# Patient Record
Sex: Male | Born: 1970 | Race: Black or African American | Hispanic: No | Marital: Single | Smoking: Former smoker
Health system: Southern US, Community
[De-identification: ages and names within clinical notes are randomized; demographics above are authoritative.]

## PROBLEM LIST (undated history)

## (undated) DIAGNOSIS — L910 Hypertrophic scar: Secondary | ICD-10-CM

## (undated) DIAGNOSIS — Z720 Tobacco use: Secondary | ICD-10-CM

## (undated) DIAGNOSIS — K5900 Constipation, unspecified: Secondary | ICD-10-CM

## (undated) DIAGNOSIS — J939 Pneumothorax, unspecified: Secondary | ICD-10-CM

## (undated) DIAGNOSIS — E7800 Pure hypercholesterolemia, unspecified: Secondary | ICD-10-CM

## (undated) DIAGNOSIS — R140 Abdominal distension (gaseous): Secondary | ICD-10-CM

## (undated) DIAGNOSIS — J189 Pneumonia, unspecified organism: Secondary | ICD-10-CM

## (undated) HISTORY — DX: Abdominal distension (gaseous): R14.0

## (undated) HISTORY — DX: Pneumothorax, unspecified: J93.9

## (undated) HISTORY — DX: Constipation, unspecified: K59.00

## (undated) HISTORY — DX: Pneumonia, unspecified organism: J18.9

## (undated) HISTORY — DX: Tobacco use: Z72.0

## (undated) HISTORY — DX: Hypertrophic scar: L91.0

---

## 2002-12-13 HISTORY — PX: CHEST TUBE INSERTION: SHX231

## 2005-01-11 ENCOUNTER — Emergency Department (HOSPITAL_COMMUNITY): Admission: EM | Admit: 2005-01-11 | Discharge: 2005-01-11 | Payer: Self-pay | Admitting: Emergency Medicine

## 2006-03-08 ENCOUNTER — Ambulatory Visit (HOSPITAL_BASED_OUTPATIENT_CLINIC_OR_DEPARTMENT_OTHER): Admission: RE | Admit: 2006-03-08 | Discharge: 2006-03-08 | Payer: Self-pay | Admitting: General Surgery

## 2006-03-08 ENCOUNTER — Encounter (INDEPENDENT_AMBULATORY_CARE_PROVIDER_SITE_OTHER): Payer: Self-pay | Admitting: *Deleted

## 2007-07-31 ENCOUNTER — Emergency Department (HOSPITAL_COMMUNITY): Admission: EM | Admit: 2007-07-31 | Discharge: 2007-07-31 | Payer: Self-pay | Admitting: Family Medicine

## 2007-08-02 ENCOUNTER — Emergency Department (HOSPITAL_COMMUNITY): Admission: EM | Admit: 2007-08-02 | Discharge: 2007-08-02 | Payer: Self-pay | Admitting: Emergency Medicine

## 2007-08-25 ENCOUNTER — Emergency Department (HOSPITAL_COMMUNITY): Admission: EM | Admit: 2007-08-25 | Discharge: 2007-08-25 | Payer: Self-pay | Admitting: Family Medicine

## 2007-08-27 ENCOUNTER — Emergency Department (HOSPITAL_COMMUNITY): Admission: EM | Admit: 2007-08-27 | Discharge: 2007-08-27 | Payer: Self-pay | Admitting: Emergency Medicine

## 2010-12-13 DIAGNOSIS — J189 Pneumonia, unspecified organism: Secondary | ICD-10-CM

## 2010-12-13 HISTORY — DX: Pneumonia, unspecified organism: J18.9

## 2011-02-13 ENCOUNTER — Emergency Department (HOSPITAL_COMMUNITY): Payer: Self-pay

## 2011-02-13 ENCOUNTER — Inpatient Hospital Stay (HOSPITAL_COMMUNITY)
Admission: EM | Admit: 2011-02-13 | Discharge: 2011-02-16 | DRG: 195 | Disposition: A | Discharge status: Home / Self Care | Payer: Self-pay | Attending: Internal Medicine | Admitting: Internal Medicine

## 2011-02-13 DIAGNOSIS — R911 Solitary pulmonary nodule: Secondary | ICD-10-CM | POA: Diagnosis present

## 2011-02-13 DIAGNOSIS — J449 Chronic obstructive pulmonary disease, unspecified: Secondary | ICD-10-CM | POA: Diagnosis present

## 2011-02-13 DIAGNOSIS — J4489 Other specified chronic obstructive pulmonary disease: Secondary | ICD-10-CM | POA: Diagnosis present

## 2011-02-13 DIAGNOSIS — F17200 Nicotine dependence, unspecified, uncomplicated: Secondary | ICD-10-CM | POA: Diagnosis present

## 2011-02-13 DIAGNOSIS — J189 Pneumonia, unspecified organism: Principal | ICD-10-CM | POA: Diagnosis present

## 2011-02-13 DIAGNOSIS — D72829 Elevated white blood cell count, unspecified: Secondary | ICD-10-CM | POA: Diagnosis present

## 2011-02-13 LAB — COMPREHENSIVE METABOLIC PANEL
ALT: 13 U/L (ref 0–53)
AST: 16 U/L (ref 0–37)
Albumin: 3.5 g/dL (ref 3.5–5.2)
Alkaline Phosphatase: 65 U/L (ref 39–117)
BUN: 8 mg/dL (ref 6–23)
CO2: 26 mEq/L (ref 19–32)
Calcium: 8.7 mg/dL (ref 8.4–10.5)
Chloride: 101 mEq/L (ref 96–112)
Creatinine, Ser: 0.83 mg/dL (ref 0.4–1.5)
GFR calc Af Amer: >60 mL/min (ref >60)
GFR calc non Af Amer: >60 mL/min (ref >60)
Glucose, Bld: 93 mg/dL (ref 70–99)
Potassium: 4 mEq/L (ref 3.5–5.1)
Sodium: 137 mEq/L (ref 135–145)
Total Bilirubin: 0.7 mg/dL (ref 0.3–1.2)
Total Protein: 6.8 g/dL (ref 6.0–8.3)

## 2011-02-13 LAB — CBC
HCT: 42.6 % (ref 39.0–52.0)
Hemoglobin: 14.9 g/dL (ref 13.0–17.0)
MCH: 32.5 pg (ref 26.0–34.0)
MCHC: 35 g/dL (ref 30.0–36.0)
MCV: 92.8 fL (ref 78.0–100.0)
Platelets: 261 10*3/uL (ref 150–400)
RBC: 4.59 MIL/uL (ref 4.22–5.81)
RDW: 13.4 % (ref 11.5–15.5)
WBC: 17 10*3/uL — ABNORMAL HIGH (ref 4.0–10.5)

## 2011-02-13 LAB — DIFFERENTIAL
Basophils Absolute: 0 10*3/uL (ref 0.0–0.1)
Basophils Relative: 0 % (ref 0–1)
Eosinophils Absolute: 0.1 10*3/uL (ref 0.0–0.7)
Eosinophils Relative: 1 % (ref 0–5)
Lymphocytes Relative: 18 % (ref 12–46)
Lymphs Abs: 2.9 10*3/uL (ref 0.7–4.0)
Monocytes Absolute: 1.8 10*3/uL — ABNORMAL HIGH (ref 0.1–1.0)
Monocytes Relative: 11 % (ref 3–12)
Neutro Abs: 11.7 10*3/uL — ABNORMAL HIGH (ref 1.7–7.7)
Neutrophils Relative %: 71 % (ref 43–77)

## 2011-02-13 LAB — LIPASE, BLOOD: Lipase: 24 U/L (ref 11–59)

## 2011-02-13 LAB — D-DIMER, QUANTITATIVE: D-Dimer, Quant: 1.24 ug/mL-FEU — ABNORMAL HIGH (ref 0.00–0.48)

## 2011-02-13 MED ORDER — IOHEXOL 300 MG/ML  SOLN
100.0000 mL | Freq: Once | INTRAMUSCULAR | Status: AC | PRN
  Administered 2011-02-13: 100 mL via INTRAVENOUS

## 2011-02-14 ENCOUNTER — Inpatient Hospital Stay (HOSPITAL_COMMUNITY): Payer: Self-pay

## 2011-02-14 ENCOUNTER — Emergency Department (HOSPITAL_COMMUNITY): Payer: Self-pay

## 2011-02-14 LAB — URINALYSIS, ROUTINE W REFLEX MICROSCOPIC
Bilirubin Urine: NEGATIVE
Glucose, UA: NEGATIVE mg/dL
Ketones, ur: NEGATIVE mg/dL
Leukocytes, UA: NEGATIVE
Nitrite: NEGATIVE
Protein, ur: NEGATIVE mg/dL
Specific Gravity, Urine: 1.026 (ref 1.005–1.030)
Urobilinogen, UA: 1 mg/dL (ref 0.0–1.0)
pH: 6 (ref 5.0–8.0)

## 2011-02-14 LAB — COMPREHENSIVE METABOLIC PANEL
ALT: 11 U/L (ref 0–53)
AST: 12 U/L (ref 0–37)
Albumin: 3.1 g/dL — ABNORMAL LOW (ref 3.5–5.2)
Alkaline Phosphatase: 59 U/L (ref 39–117)
BUN: 5 mg/dL — ABNORMAL LOW (ref 6–23)
CO2: 27 mEq/L (ref 19–32)
Calcium: 8.6 mg/dL (ref 8.4–10.5)
Chloride: 102 mEq/L (ref 96–112)
Creatinine, Ser: 0.88 mg/dL (ref 0.4–1.5)
GFR calc Af Amer: >60 mL/min (ref >60)
GFR calc non Af Amer: >60 mL/min (ref >60)
Glucose, Bld: 108 mg/dL — ABNORMAL HIGH (ref 70–99)
Potassium: 3.7 mEq/L (ref 3.5–5.1)
Sodium: 139 mEq/L (ref 135–145)
Total Bilirubin: 1.2 mg/dL (ref 0.3–1.2)
Total Protein: 6.4 g/dL (ref 6.0–8.3)

## 2011-02-14 LAB — MAGNESIUM: Magnesium: 1.8 mg/dL (ref 1.5–2.5)

## 2011-02-14 LAB — APTT: aPTT: 36 seconds (ref 24–37)

## 2011-02-14 LAB — CBC
HCT: 40.6 % (ref 39.0–52.0)
Hemoglobin: 13.7 g/dL (ref 13.0–17.0)
MCH: 31.4 pg (ref 26.0–34.0)
MCHC: 33.7 g/dL (ref 30.0–36.0)
MCV: 93.1 fL (ref 78.0–100.0)
Platelets: 239 10*3/uL (ref 150–400)
RBC: 4.36 MIL/uL (ref 4.22–5.81)
RDW: 13.4 % (ref 11.5–15.5)
WBC: 15.8 10*3/uL — ABNORMAL HIGH (ref 4.0–10.5)

## 2011-02-14 LAB — DIFFERENTIAL
Basophils Absolute: 0 10*3/uL (ref 0.0–0.1)
Basophils Relative: 0 % (ref 0–1)
Eosinophils Absolute: 0.1 10*3/uL (ref 0.0–0.7)
Eosinophils Relative: 1 % (ref 0–5)
Lymphocytes Relative: 10 % — ABNORMAL LOW (ref 12–46)
Lymphs Abs: 1.6 10*3/uL (ref 0.7–4.0)
Monocytes Absolute: 1.9 10*3/uL — ABNORMAL HIGH (ref 0.1–1.0)
Monocytes Relative: 12 % (ref 3–12)
Neutro Abs: 12.1 10*3/uL — ABNORMAL HIGH (ref 1.7–7.7)
Neutrophils Relative %: 77 % (ref 43–77)

## 2011-02-14 LAB — URINE MICROSCOPIC-ADD ON

## 2011-02-14 LAB — PHOSPHORUS: Phosphorus: 3.4 mg/dL (ref 2.3–4.6)

## 2011-02-14 LAB — LACTIC ACID, PLASMA: Lactic Acid, Venous: 2.6 mmol/L — ABNORMAL HIGH (ref 0.5–2.2)

## 2011-02-14 LAB — PROTIME-INR
INR: 1.12 (ref 0.00–1.49)
Prothrombin Time: 14.6 seconds (ref 11.6–15.2)

## 2011-02-14 MED ORDER — IOHEXOL 300 MG/ML  SOLN: 130.0000 mL | Freq: Once | INTRAMUSCULAR | Status: AC | PRN

## 2011-02-15 DIAGNOSIS — J189 Pneumonia, unspecified organism: Secondary | ICD-10-CM

## 2011-02-15 DIAGNOSIS — F17200 Nicotine dependence, unspecified, uncomplicated: Secondary | ICD-10-CM

## 2011-02-15 DIAGNOSIS — R222 Localized swelling, mass and lump, trunk: Secondary | ICD-10-CM

## 2011-02-15 LAB — URINE CULTURE
Colony Count: NO GROWTH
Culture  Setup Time: 201203041533
Culture: NO GROWTH

## 2011-02-15 LAB — COMPREHENSIVE METABOLIC PANEL
ALT: 10 U/L (ref 0–53)
AST: 13 U/L (ref 0–37)
Albumin: 2.8 g/dL — ABNORMAL LOW (ref 3.5–5.2)
Alkaline Phosphatase: 56 U/L (ref 39–117)
BUN: 6 mg/dL (ref 6–23)
CO2: 28 mEq/L (ref 19–32)
Calcium: 8.6 mg/dL (ref 8.4–10.5)
Chloride: 104 mEq/L (ref 96–112)
Creatinine, Ser: 1.06 mg/dL (ref 0.4–1.5)
GFR calc Af Amer: >60 mL/min (ref >60)
GFR calc non Af Amer: >60 mL/min (ref >60)
Glucose, Bld: 103 mg/dL — ABNORMAL HIGH (ref 70–99)
Potassium: 3.8 mEq/L (ref 3.5–5.1)
Sodium: 138 mEq/L (ref 135–145)
Total Bilirubin: 1.1 mg/dL (ref 0.3–1.2)
Total Protein: 6.3 g/dL (ref 6.0–8.3)

## 2011-02-15 LAB — CBC
HCT: 40.3 % (ref 39.0–52.0)
Hemoglobin: 13.7 g/dL (ref 13.0–17.0)
MCH: 31.9 pg (ref 26.0–34.0)
MCHC: 34 g/dL (ref 30.0–36.0)
MCV: 93.7 fL (ref 78.0–100.0)
Platelets: 253 10*3/uL (ref 150–400)
RBC: 4.3 MIL/uL (ref 4.22–5.81)
RDW: 13.2 % (ref 11.5–15.5)
WBC: 12.2 10*3/uL — ABNORMAL HIGH (ref 4.0–10.5)

## 2011-02-20 LAB — CULTURE, BLOOD (ROUTINE X 2)
Culture  Setup Time: 201203041124
Culture  Setup Time: 201203041124
Culture: NO GROWTH
Culture: NO GROWTH

## 2011-02-23 ENCOUNTER — Telehealth: Payer: Self-pay | Admitting: Emergency Medicine

## 2011-02-23 DIAGNOSIS — J984 Other disorders of lung: Secondary | ICD-10-CM | POA: Insufficient documentation

## 2011-02-24 ENCOUNTER — Other Ambulatory Visit: Payer: Self-pay | Admitting: Emergency Medicine

## 2011-02-24 DIAGNOSIS — R911 Solitary pulmonary nodule: Secondary | ICD-10-CM

## 2011-02-25 NOTE — Discharge Summary (Signed)
NAMEHILMER, ALIBERTI                  ACCOUNT NO.:  [id number]  MEDICAL RECORD NO.:  [id number]           PATIENT TYPE:  I  LOCATION:  5526                         FACILITY:  [facility]  PHYSICIAN:  Peggye Pitt, M.D. DATE OF BIRTH:  04/09/1971  DATE OF ADMISSION:  02/13/2011 DATE OF DISCHARGE:  02/16/2011                              DISCHARGE SUMMARY   DISCHARGE DIAGNOSES: 1. Right base airspace disease, query pneumonia. 2. Multiple pulmonary nodules, question infectious versus malignancy.  DISCHARGE MEDICATIONS: 1. Avelox 400 mg daily for 14 days. 2. Nicotine patch 21 mg to change daily. 3. Ibuprofen 200 mg 2-3 tablets 3 times a day as needed for pain.  CONSULTATION THIS HOSPITALIZATION:  Dr. Darrol Angel.  DISPOSITION AND FOLLOWUP:  Wesley Alvarez will be discharged home today in stable condition.  He will need to take 14 days of antibiotics and then follow up with Dr. Delton Coombes on March 05, 2011, at 1:30 p.m. at the [facility] office.  He will need to have a CT scan scheduled shortly before that appointment time, and I will have our case manager schedule that for [place].  PROCEDURES THIS HOSPITALIZATION: 1. A chest x-ray on February 13, 2011, that showed bronchitic-type lung     changes, likely related to smoking with no infiltrates, edema, or     effusions. 2. CT angiogram of the chest on February 13, 2011, that showed no CT     findings for pulmonary embolism.  Normal thoracic aorta.  Three     pulmonary lesions are described above.  Septic emboli and     metastatic disease are considerations.  Borderline enlarged hilar     lymph nodes, right greater than left.  Bullous changes. 3. A CT scan of the abdomen and pelvis on February 14, 2011, that showed     left lower lobe pulmonary nodule with posterior right lower lobe     airspace consolidation.  The right lower lobe disease has     progressed since yesterday's chest CT.  No findings in the abdomen     and pelvis to suggest neoplasm. 4. A CT  scan of the head on February 14, 2011, that was normal with mild     chronic sinusitis.  HISTORY AND PHYSICAL:  For details, please see dictation by Dr. Gerri Lins on February 14, 2011, but in brief, Mr. Dollar is a pleasant 40 year old African American gentleman with past medical history significant for tobacco abuse, who presented to the hospital with right-sided chest pain.  In the workup, a CT angio was done with findings as above and because of this, we were asked to admit him for further evaluation and management.  HOSPITAL COURSE BY PROBLEM: 1. Right basilar airspace disease.  He was started on antibiotic     therapy including vancomycin and Zosyn for a possibility of     pneumonia.  There were also some pulmonary nodules that could have     been septic emboli and this was the main reason for the broad-     spectrum therapy.  He will be discharged to complete 14 days of  Avelox.  Please see below for details. 2. Multiple pulmonary nodules.  CT scan was concerning for malignancy     versus septic emboli.  A TEE was performed that did not show any     evidence for vegetations, thrombus.  There was a negative study for     PFOs.  Pulmonology consult was obtained.  They have recommended     that given the negative TEE finding that we give him a 14-day     course of antibiotics to see if the nodules resolve or improve.     After his 14 days of antibiotics, he will need another repeat a     chest CT with followup by Dr. Delton Coombes as scheduled above to determine     whether bronchoscopy may be indicated.  If biopsy is indicated,     given location of nodules, I believe that mediastinoscopy would     probably be higher yield than a bronchoscopy would be. 3. For his tobacco abuse, he has been given aggressive tobacco     cessation, counseling while in the hospital.  He agrees to quit     smoking.  We have given him some patches to assist him.  VITALS ON THE DAY OF DISCHARGE:  Blood pressure 129/88,  heart rate 81, respirations 20, sats of 99% on room air, and a temp of 97.9.     Peggye Pitt, M.D.     EH/MEDQ  D:  02/16/2011  T:  02/17/2011  Job:  [id number]  cc:   Leslye Peer, MD Dr. Darrol Angel  Electronically Signed by Peggye Pitt M.D. on 02/25/2011 05:43:19 PM

## 2011-02-26 NOTE — H&P (Signed)
Wesley Alvarez, Wesley Alvarez                  ACCOUNT NO.:  [id number]  MEDICAL RECORD NO.:  [id number]           PATIENT TYPE:  I  LOCATION:  5526                         FACILITY:  [facility]  PHYSICIAN:  Treylin Burtch, DO         DATE OF BIRTH:  [birth year]  DATE OF ADMISSION:  02/13/2011 DATE OF DISCHARGE:                             HISTORY & PHYSICAL   CHIEF COMPLAINT:  Right-sided chest pain.  HISTORY OF PRESENT ILLNESS:  The patient is a 40 year old male who states he has no known prior health issues poor.  Of course, he did not see a doctor, but that was his impression.  Patient states about 3 days ago, he was coughing.  He does have a smoker's cough every morning.  The cough itself was nothing unusual, but he states he heard a pop and then he had pain on the right side of his chest going through to his back and radiated up towards his shoulder blades.  Since that time, he states the pain has been almost continuous.  It is aggravated by movement or manipulation of his upper body.  It is not made worse by exertion per se.  He can walk without making the pain worse.  He states although he is not really short of breath, it makes him feel short of breath because it hurts to take a deep breath.  He states that it ranges from 5/10 to 10/10 in severity.  It is not associated with nausea and vomiting. Patient has had a poor appetite for some time according to his wife for about 6 weeks or so.  The patient any loss of weight or fevers or chills.  PAST MEDICAL HISTORY:  The patient has had a left chest spontaneous pneumothorax in the past.  No past surgical history.  MEDICATIONS:  Advil, that is what he has been taking for pain.  SOCIAL HISTORY:  The patient has about a 30-pack year smoking history. He continues to smoke, drinks alcohol occasionally.  Denies any recreational drug use.  Denies any IV drug use at all.  ALLERGIES:  NKDA.  FAMILY HISTORY:  Positive for head and neck cancer in his  father.  The patient is unable to remember what exact kind of cancer it was.  His father was 73 at that time.  His mother is healthy.  REVIEW OF SYSTEMS:  CONSTITUTIONAL:  Negative for fever.  Negative for chills.  Positive for loss of appetite.  No loss of weight.  Positive for malaise.  CENTRAL NERVOUS SYSTEM:  No headaches, no seizures, no limb weakness.  ENT:  No nasal congestion, throat pain.  CARDIOVASCULAR: Positive for chest pain that is pleuritic and mostly related on the right.  No palpitations, no orthopnea.  RESPIRATORY:  Positive for received shortness of breath, simply because it hurts the right side of his chest when he takes a deep breath, but otherwise he states he does not feel short of breath.  No increased cough.  The patient has a chronic smoker's cough every morning.  No sputum production.  No wheeze. GASTROINTESTINAL:  No nausea,  no vomiting.  No constipation, no diarrhea.  GENITOURINARY:  No dysuria, hematuria, no urinary frequency. HEMATOLOGICAL:  No easy bruising, no purpura, no clots.  RENAL:  No flank pain.  No swelling.  No pruritus.  SKIN:  No rashes or sores.  No lesions.  The patient does occasionally gets sebaceous cyst, but he does not have any at this current time.  LYMPH:  No lymphadenopathy.  No painful nodes.  No specific lymph swelling.  PSYCHIATRIC:  No anxiety, no depression, no insomnia.  PHYSICAL EXAMINATION:  VITAL SIGNS:  Temperature 98, pulse 99, respiratory rate 18, blood pressure 139/101, O2 sats 100% on 2 liters. GENERAL:  The patient is awake, but somnolent.  He is able to give fair history. HEENT:  Eyes:  Pupils equal, round, react to light and accommodation. External ocular movements are intact.  Sclerae nonicteric, noninjected. Mouth:  Oral mucosa is dry.  No lesions.  No sores.  Pharynx is clear. No erythema, no exudate. NECK: Negative for JVD.  Negative for thyromegaly.  Negative for lymphadenopathy. HEART:  Regular rate and  rhythm at 90 beats per minute without murmur, ectopy, or gallops.  No lateral PMI.  No thrills. LUNGS:  Clear to auscultation bilaterally without wheezes, rales or rhonchi.  No increased work of breathing.  No tactile fremitus. ABDOMEN:  Soft, nontender, nondistended.  Positive bowel sounds.  No hepatosplenomegaly.  No hernias palpated. EXTREMITIES:  Negative for cyanosis, clubbing, or edema.  Fair dorsalis pedis and popliteal pulses bilaterally.  No carotid bruits bilaterally. NEUROLOGIC: Cranial nerves II-XII are grossly intact.  Motor and sensory intact.  LABORATORY DATA:  WBC is 17.0, hemoglobin 49, hematocrit 42.6, platelets 261.  He has 71% neutrophils.  Sodium 137, potassium for 4.0, chloride 101, CO2 26, BUN is 8, creatinine 0.83, glucose 93, albumin 3.5, calcium 8.7, T-bili 0.7, AST 16, ALT 13, total protein 6.8.  Lactic acid is 2.6, lipase is 24.  CT of the chest shows biapical bullous changes, right greater than left.  The patient has a rounded density of the right lung base, atelectasis versus mass, and he has 2 more rounded lesions on the left side of his chest, one in left lower lobe, one in the left upper lobe, which has a slight cavitation to suggest it may be that these represent either a metastatic disease or septic emboli.  Portable chest shows bronchitic changes.  ASSESSMENT: 1. Abnormal CTA of the chest.  Concern is for septic emboli,     particularly with the patient's elevated white blood cell count     versus metastatic disease. 2. Chest pain on the right, likely due to mass versus annulus emboli. 3. Chronic obstructive pulmonary disease, which appears quiescent. 4. Leukocytosis, likely due to this mass versus emboli. 5. Tobacco abuse, which is ongoing.  PLAN: 1. Admit. 2. Blood cultures x2. 3. Empiric antibiotic coverage. 4. Echocardiogram.  We will start with a transthoracic echocardiogram.     More than likely, this patient will require TEE. 5.  Pulmonary consult. 6. CT brain, abdomen, and pelvis. 7. Check a PSA.  I spent 54 minutes on this admission.          ______________________________ Fran Lowes, DO     AS/MEDQ  D:  02/14/2011  T:  02/14/2011  Job:  [id number]  Electronically Signed by Fran Lowes DO on 02/26/2011 04:52:22 PM

## 2011-03-01 ENCOUNTER — Ambulatory Visit (INDEPENDENT_AMBULATORY_CARE_PROVIDER_SITE_OTHER)
Admission: RE | Admit: 2011-03-01 | Discharge: 2011-03-01 | Disposition: A | Discharge status: Home / Self Care | Payer: Self-pay | Source: Ambulatory Visit | Attending: Emergency Medicine | Admitting: Emergency Medicine

## 2011-03-01 DIAGNOSIS — J984 Other disorders of lung: Secondary | ICD-10-CM

## 2011-03-01 DIAGNOSIS — R911 Solitary pulmonary nodule: Secondary | ICD-10-CM

## 2011-03-01 MED ORDER — IOHEXOL 300 MG/ML  SOLN
98.0000 mL | Freq: Once | INTRAMUSCULAR | Status: AC | PRN
  Administered 2011-03-01: 98 mL via INTRAVENOUS

## 2011-03-02 NOTE — Progress Notes (Signed)
Summary: needs chest ct prior to 03/05/11 appt  Phone Note Call from Patient   Caller: Patient Call For: Azani Brogdon Summary of Call: Patient phoned stated that his discharge papers stated that he needed to have a Chest CT a few days prior to his follow up with Dr Delton Coombes. His hospital follow up is scheduled for 03/05/11 at 1:30pm. Patient can be reached at [phone] Initial call taken by: Vedia Coffer,  February 23, 2011 9:12 AM  Follow-up for Phone Call        Dr. Delton Coombes this pt has a HFU scheduled with you on 03-05-11 and according to his discharge summary he needs a CT scheduled prior to that visit. Pt wants to  know if this can be ordered. Please advise. Carron Curie CMA  February 23, 2011 9:45 AM  He needs CT with contrast and superD cuts. I will place order now Leslye Peer MD  February 24, 2011 5:05 PM   Follow-up by: Leslye Peer MD,  February 24, 2011 5:06 PM  Additional Follow-up for Phone Call Additional follow up Details #1::        CT Chest with contrast and super D cuts has been scheduled for Monday 03/01/11 at 11:00 [facility] [address]. NPO 2 hours before. Pt has been contacted and is aware of appt date, time and location of CT as well as instructions. Alfonso Ramus  February 24, 2011 5:15 PM Rose at [facility] will send disk to Dr. Margaree Mackintosh  February 24, 2011 5:16 PM   New Problems: PULMONARY NODULE (ICD-518.89)   New Problems: PULMONARY NODULE (ICD-518.89)

## 2011-03-04 ENCOUNTER — Encounter: Payer: Self-pay | Admitting: Emergency Medicine

## 2011-03-04 DIAGNOSIS — Z720 Tobacco use: Secondary | ICD-10-CM

## 2011-03-04 DIAGNOSIS — J939 Pneumothorax, unspecified: Secondary | ICD-10-CM

## 2011-03-05 ENCOUNTER — Ambulatory Visit (INDEPENDENT_AMBULATORY_CARE_PROVIDER_SITE_OTHER): Payer: Self-pay | Admitting: Emergency Medicine

## 2011-03-05 ENCOUNTER — Encounter: Payer: Self-pay | Admitting: Emergency Medicine

## 2011-03-05 VITALS — BP 112/76 | HR 76 | Temp 97.4°F | Ht 72.0 in | Wt 194.6 lb

## 2011-03-05 DIAGNOSIS — J984 Other disorders of lung: Secondary | ICD-10-CM

## 2011-03-05 DIAGNOSIS — R911 Solitary pulmonary nodule: Secondary | ICD-10-CM

## 2011-03-05 NOTE — Assessment & Plan Note (Signed)
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

## 2011-03-05 NOTE — Patient Instructions (Signed)
We will repeat your CT scan of the chest in March 2013, or sooner if you develop new symptoms We will perform full PFT's at your next office visit We will perform bloodwork today to look for risk for early emphysema Follow up with Dr Delton Coombes next available.

## 2011-03-05 NOTE — Assessment & Plan Note (Signed)
Nodules have almost fully resolved, suspect these were bacterial PNA. TEE without evidence for endocarditis - no intervention at this time - repeat CT scan in a yr to insure no interval change,

## 2011-03-05 NOTE — Progress Notes (Signed)
Subjective:    Patient ID: Wesley Alvarez, male    DOB: 1971/07/03, 40 y.o.   MRN: [id number]  HPI 40 yo former smoker with little PMH except spont L PTX in the past. He was admitted to [facility] with chest discomfort and cough 02/13/11. His CT scan of the chest 02/13/11 showed scattered nodular disease and some apical emphysema/bullus dz. He was treated with abx, clinically improved. TEE without PVO or endocarditis. Repeat CT scan done  - shows almost full resolution of the nodular infiltrates. Presents now to review the findings. Of note the Ct scan shows apical emphysematous changes.      Review of Systems  Constitutional: Negative for fever, activity change, appetite change and fatigue.  HENT: Negative for congestion, rhinorrhea, sneezing, postnasal drip and sinus pressure.   Respiratory: Negative for cough, chest tightness, shortness of breath, wheezing and stridor.   Cardiovascular: Negative for chest pain.  Musculoskeletal: Negative for back pain.       Objective:   Physical Exam  Constitutional: He is oriented to person, place, and time. He appears well-developed and well-nourished. No distress.  HENT:  Head: Normocephalic and atraumatic.  Mouth/Throat: No oropharyngeal exudate.  Eyes: Conjunctivae are normal. Pupils are equal, round, and reactive to light.  Neck: Normal range of motion. Neck supple. No tracheal deviation present. No thyromegaly present.  Cardiovascular: Normal rate, regular rhythm and normal heart sounds.  Exam reveals no gallop and no friction rub.   No murmur heard. Pulmonary/Chest: Effort normal and breath sounds normal. No stridor. No respiratory distress. He has no wheezes. He has no rales. He exhibits no tenderness.  Abdominal: Soft.  Musculoskeletal: Normal range of motion. He exhibits no edema.  Lymphadenopathy:    He has no cervical adenopathy.  Neurological: He is alert and oriented to person, place, and time.  Skin: Skin is warm and dry. He is not  diaphoretic. No erythema.       CT scan 02/13/11:  Comparison: Chest x-ray 02/13/2011.  Findings: The chest wall is unremarkable. There is a sebaceous  cyst noted near the left axilla. Small axillary and  supraclavicular lymph nodes but no adenopathy. The thyroid gland  appears normal. The bony thorax is intact.  The heart is normal in size. No pericardial effusion. Small  scattered mediastinal lymph nodes without adenopathy. There are  borderline enlarged hilar lymph nodes, right greater than left.  The esophagus is grossly normal.  Examination of the lung parenchyma demonstrates biapical bullous  changes, right greater than left. No pneumothorax. There is a  rounded density at the right lung base without air bronchograms.  This could be a rounded atelectasis or a pulmonary mass. There is  a second rounded lesion in the left lower lobe on image number 64  which measures 15 mm. The third lesion in the left upper lobe on  image #54 demonstrates slight cavitation. The these findings could  be due to metastatic disease or possibly septic emboli.  The pulmonary arterial tree is well opacified. No filling defects  are seen to suggest pulmonary emboli. The aorta is normal in  caliber. No dissection  CT scan 02/24/11:  Comparison: 02/13/2011.  Findings: No pathologically enlarged mediastinal, hilar or  axillary lymph nodes. A low density lesion in the subcutaneous fat  of the left axilla measures 1.9 cm and is likely a sebaceous cyst.  Heart size normal. No pericardial effusion.  Bullous changes are seen in the apices bilaterally. There is a 4  mm nodule  in the right lower lobe (image 41). Interval improvement  in subpleural air space consolidation in the right lower lobe. Near  complete resolution of focal areas of rounded air space  consolidation in the lingula and left upper lobe (images 35 and 32,  respectively). Lungs are otherwise clear. No pleural fluid.  Airway is unremarkable.    Incidental imaging of the upper abdomen shows an unenlarged  retroperitoneal lymph node adjacent to the left adrenal gland. No  worrisome lytic or sclerotic lesions.  IMPRESSION:  Resolving foci of pneumonia in the lingula and both lower lobes.    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 TEE without evidence for endocarditis - no intervention at this time - repeat CT scan in a yr to insure no interval change,

## 2011-03-28 ENCOUNTER — Encounter: Payer: Self-pay | Admitting: Emergency Medicine

## 2011-04-09 ENCOUNTER — Encounter: Payer: Self-pay | Admitting: Emergency Medicine

## 2011-04-13 ENCOUNTER — Encounter: Payer: Self-pay | Admitting: Emergency Medicine

## 2011-04-13 ENCOUNTER — Ambulatory Visit (INDEPENDENT_AMBULATORY_CARE_PROVIDER_SITE_OTHER): Payer: Self-pay | Admitting: Emergency Medicine

## 2011-04-13 VITALS — BP 120/82 | HR 70 | Temp 97.8°F | Ht 72.0 in | Wt 193.0 lb

## 2011-04-13 DIAGNOSIS — J939 Pneumothorax, unspecified: Secondary | ICD-10-CM

## 2011-04-13 DIAGNOSIS — J984 Other disorders of lung: Secondary | ICD-10-CM

## 2011-04-13 DIAGNOSIS — J9383 Other pneumothorax: Secondary | ICD-10-CM

## 2011-04-13 LAB — PULMONARY FUNCTION TEST

## 2011-04-13 NOTE — Progress Notes (Signed)
PFT Done today. 

## 2011-04-13 NOTE — Patient Instructions (Signed)
We will need to repeat your CT scan of the chest in March 2013 Follow up with Dr Delton Coombes after the Ct to review the results.

## 2011-04-13 NOTE — Progress Notes (Signed)
  Subjective:    Patient ID: Wesley Alvarez, male    DOB: [birth year], 39 y.o.   MRN: [id number]  HPI 40 yo former smoker with little PMH except spont L PTX in the past. He was admitted to [facility] with chest discomfort and cough 02/13/11. His CT scan of the chest 02/13/11 showed scattered nodular disease and some apical emphysema/bullus dz. He was treated with abx, clinically improved. TEE without PVO or endocarditis. Repeat CT scan done  - shows almost full resolution of the nodular infiltrates. Presents now to review the findings. Of note the Ct scan shows apical emphysematous changes.     ROV 04/13/11 -- here to review PFT's and a1-AT testing (03/05/11 = MM, normal). CT scan showed resolving nodular dz but possible areas of emphysema.  PFT today - normal AF, no BD response, normal volumes, normal DLCO.    Review of Systems  Constitutional: Negative for fever, activity change, appetite change and fatigue.  HENT: Negative for congestion, rhinorrhea, sneezing, postnasal drip and sinus pressure.   Respiratory: Negative for cough, chest tightness, shortness of breath, wheezing and stridor.   Cardiovascular: Negative for chest pain.  Musculoskeletal: Negative for back pain.       Objective:   Physical Exam  Constitutional: He is oriented to person, place, and time. He appears well-developed and well-nourished. No distress.  HENT:  Head: Normocephalic and atraumatic.  Mouth/Throat: No oropharyngeal exudate.  Eyes: Conjunctivae are normal. Pupils are equal, round, and reactive to light.  Neck: Normal range of motion. Neck supple. No tracheal deviation present. No thyromegaly present.  Cardiovascular: Normal rate, regular rhythm and normal heart sounds.  Exam reveals no gallop and no friction rub.   No murmur heard. Pulmonary/Chest: Effort normal and breath sounds normal. No stridor. No respiratory distress. He has no wheezes. He has no rales. He exhibits no tenderness.  Abdominal: Soft.  Musculoskeletal:  Normal range of motion. He exhibits no edema.  Lymphadenopathy:    He has no cervical adenopathy.  Neurological: He is alert and oriented to person, place, and time.  Skin: Skin is warm and dry. He is not diaphoretic. No erythema.      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  - no other intervention or meds planned - congratulated him on stopping smoking  PULMONARY NODULE Nodules have almost fully resolved, suspect these were bacterial PNA. TEE without evidence for endocarditis - repeat CT scan in a yr to insure no interval change,

## 2011-04-23 ENCOUNTER — Encounter: Payer: Self-pay | Admitting: Emergency Medicine

## 2011-04-26 ENCOUNTER — Encounter: Payer: Self-pay | Admitting: Emergency Medicine

## 2011-04-30 NOTE — Op Note (Signed)
[name], Wesley Alvarez                  ACCOUNT NO.:  [id number]   MEDICAL RECORD NO.:  [id number]          PATIENT TYPE:  AMB   LOCATION:  DSC                          FACILITY:  [facility]   PHYSICIAN:  Wesley Alvarez, M.D.DATE OF BIRTH:  03/21/1971   DATE OF PROCEDURE:  03/08/2006  DATE OF DISCHARGE:                                 OPERATIVE REPORT   PREOPERATIVE DIAGNOSIS:  Soft tissue mass of posterior neck, suspect cyst.   POSTOP DIAGNOSIS:  2.0-cm cystic process, soft tissue posterior neck.   OPERATION PERFORMED:  Excision 2.0 cm soft tissue mass, posterior neck   SURGEON:  Wesley Alvarez, M.D.   OPERATIVE INDICATIONS:  A 40 year old black man who has had a 1-year history  of enlarging mass of posterior neck.  This has never been infected but it is  irritated and itching.  On exam he has what appears to be about a 2.5 cm  raised soft tissue mass in the midline posterior neck just below the  hairline.  There was no other adenopathy or mass.  He desires that this be  excised for diagnosis and to prevent future infection.  He has been  counseled regarding this and is operated upon electively.   OPERATIVE TECHNIQUE:  The patient was placed prone on the operating table in  this [facility] minor procedure room.  Posterior neck was prepped and  draped in sterile fashion.  1% Xylocaine with epinephrine was used as local  infiltration anesthetic.  A transverse elliptical incision was made  overlying the soft tissue mass.  This incision was probably 3.5 to 4 cm in  length to accommodate the size of this mass.  Dissection was carried around  and behind the mass with sharp dissection and the mass was removed.  The  mass was sent to pathology for examination.  Hemostasis was excellent.  The  wound was irrigated with saline.  Subcutaneous tissue was closed with three  interrupted sutures of 3-0 Vicryl, skin closed with five interrupted sutures  of 4-0 nylon.  Clean bandage was placed.   The patient taken recovery room in  stable condition.  Estimated blood loss was about 5 mL.  Complications none.  Sponge, needle, instrument counts were correct.   SUMMARY:  Bypass 89      Wesley Alvarez, M.D.  Electronically Signed     HMI/MEDQ  D:  03/08/2006  T:  03/09/2006  Job:  [id number]   cc:   Georgann Housekeeper, MD  Fax: [phone]

## 2011-09-24 LAB — CULTURE, ROUTINE-ABSCESS
Culture: NO GROWTH
Culture: NO GROWTH

## 2012-03-20 ENCOUNTER — Encounter (HOSPITAL_COMMUNITY): Payer: Self-pay | Admitting: *Deleted

## 2012-03-20 ENCOUNTER — Emergency Department (HOSPITAL_COMMUNITY)
Admission: EM | Admit: 2012-03-20 | Discharge: 2012-03-20 | Disposition: A | Discharge status: Home / Self Care | Payer: 59 | Attending: Emergency Medicine | Admitting: Emergency Medicine

## 2012-03-20 DIAGNOSIS — Z87891 Personal history of nicotine dependence: Secondary | ICD-10-CM | POA: Insufficient documentation

## 2012-03-20 DIAGNOSIS — Z882 Allergy status to sulfonamides status: Secondary | ICD-10-CM | POA: Insufficient documentation

## 2012-03-20 DIAGNOSIS — L02219 Cutaneous abscess of trunk, unspecified: Secondary | ICD-10-CM | POA: Insufficient documentation

## 2012-03-20 DIAGNOSIS — L02211 Cutaneous abscess of abdominal wall: Secondary | ICD-10-CM

## 2012-03-20 MED ORDER — CEPHALEXIN 500 MG PO CAPS: 500.0000 mg | ORAL_CAPSULE | Freq: Four times a day (QID) | ORAL | Status: AC

## 2012-03-20 MED ORDER — LIDOCAINE-EPINEPHRINE (PF) 2 %-1:200000 IJ SOLN
10.0000 mL | Freq: Once | INTRAMUSCULAR | Status: AC
  Administered 2012-03-20: 10 mL

## 2012-03-20 MED ORDER — HYDROCODONE-ACETAMINOPHEN 5-500 MG PO TABS: 1.0000 | ORAL_TABLET | Freq: Four times a day (QID) | ORAL | Status: AC | PRN

## 2012-03-20 NOTE — ED Provider Notes (Signed)
History     CSN: [id number]  Arrival date & time 03/20/12  0744   First MD Initiated Contact with Patient 03/20/12 [phone]      Chief Complaint  Patient presents with  . Abscess  . Abdominal Pain  . Constipation    (Consider location/radiation/quality/duration/timing/severity/associated sxs/prior treatment) Patient is a 41 y.o. male presenting with abscess. The history is provided by the patient.  Abscess  This is a new problem. The current episode started yesterday. The problem has been gradually worsening. The abscess is present on the abdomen. The problem is moderate. The abscess is characterized by redness, painfulness and swelling. Pertinent negatives include no fever and no vomiting.  Pt states noted a red bump on suprapubic area yesterday. Over night and today, area swelled up, red, painful to touch. States hx of abscesses. Did not take any medications. Denies fever, chills, malaise. No other complaints.  Past Medical History  Diagnosis Date  . Pneumothorax, left   . Tobacco abuse     History reviewed. No pertinent past surgical history.  Family History  Problem Relation Age of Onset  . Cancer Father 56    throat    History  Substance Use Topics  . Smoking status: Former Smoker -- 1.0 packs/day for 20 years    Types: Cigarettes  . Smokeless tobacco: Not on file  . Alcohol Use: No      Review of Systems  Constitutional: Negative for fever and chills.  HENT: Negative.   Eyes: Negative.   Respiratory: Negative.   Cardiovascular: Negative.   Gastrointestinal: Positive for abdominal pain and constipation. Negative for nausea and vomiting.  Genitourinary: Negative.   Musculoskeletal: Negative.   Skin:       ascess  Neurological: Negative.   Psychiatric/Behavioral: Negative.     Allergies  Sulfa antibiotics  Home Medications   Current Outpatient Rx  Name Route Sig Dispense Refill  . IBUPROFEN 100 MG PO TABS Oral Take 100 mg by mouth every 6 (six) hours  as needed.        BP 140/89  Pulse 77  Temp(Src) 97.9 F (36.6 C) (Oral)  Resp 18  SpO2 100%  Physical Exam  Constitutional: He is oriented to person, place, and time. He appears well-developed and well-nourished. No distress.  HENT:  Head: Normocephalic.  Eyes: Conjunctivae are normal.  Neck: Neck supple.  Cardiovascular: Normal rate, regular rhythm and normal heart sounds.   Pulmonary/Chest: Effort normal and breath sounds normal. No respiratory distress.  Abdominal: Soft. Bowel sounds are normal. There is no tenderness.       See skin exam  Neurological: He is alert and oriented to person, place, and time.  Skin: Skin is warm and dry.       4cm area of erythema, induration, tender to palpation, fluctuant to the midline suprapubic area  Psychiatric: He has a normal mood and affect.    ED Course  Procedures (including critical care time)  INCISION AND DRAINAGE Performed by: Jaynie Crumble A Consent: Verbal consent obtained. Risks and benefits: risks, benefits and alternatives were discussed Type: abscess  Body area: abdomen, lower  Anesthesia: local infiltration  Local anesthetic: lidocaine 2% w/ epinephrine  Anesthetic total:4 ml  Complexity: complex Blunt dissection to break up loculations  Drainage: purulent  Drainage amount: large  Packing material: 1/4 in iodoform gauze  Patient tolerance: Patient tolerated the procedure well with no immediate complications.   Abscess to the lower abdominal wall. No other complaints. VS normal, pt afebrile,  non toxic. Will start on antibiotics, follow up in 2 days.   1. Abscess of skin of abdomen       MDM          Lottie Mussel, PA 03/20/12 1549

## 2012-03-20 NOTE — Discharge Instructions (Signed)
Warm compresses to the area several times a day. Take ibuprofen for pain. Take vicodin as prescribed as needed for severe pain. Take keflex for the infection as prescribed until all gone. Return in 2 days for recheck. Return sooner if worsening.   Abscess An abscess (boil or furuncle) is an infected area that contains a collection of pus.  SYMPTOMS Signs and symptoms of an abscess include pain, tenderness, redness, or hardness. You may feel a moveable soft area under your skin. An abscess can occur anywhere in the body.  TREATMENT  A surgical cut (incision) may be made over your abscess to drain the pus. Gauze may be packed into the space or a drain may be looped through the abscess cavity (pocket). This provides a drain that will allow the cavity to heal from the inside outwards. The abscess may be painful for a few days, but should feel much better if it was drained.  Your abscess, if seen early, may not have localized and may not have been drained. If not, another appointment may be required if it does not get better on its own or with medications. HOME CARE INSTRUCTIONS   Only take over-the-counter or prescription medicines for pain, discomfort, or fever as directed by your caregiver.   Take your antibiotics as directed if they were prescribed. Finish them even if you start to feel better.   Keep the skin and clothes clean around your abscess.   If the abscess was drained, you will need to use gauze dressing to collect any draining pus. Dressings will typically need to be changed 3 or more times a day.   The infection may spread by skin contact with others. Avoid skin contact as much as possible.   Practice good hygiene. This includes regular hand washing, cover any draining skin lesions, and do not share personal care items.   If you participate in sports, do not share athletic equipment, towels, whirlpools, or personal care items. Shower after every practice or tournament.   If a  draining area cannot be adequately covered:   Do not participate in sports.   Children should not participate in day care until the wound has healed or drainage stops.   If your caregiver has given you a follow-up appointment, it is very important to keep that appointment. Not keeping the appointment could result in a much worse infection, chronic or permanent injury, pain, and disability. If there is any problem keeping the appointment, you must call back to this facility for assistance.  SEEK MEDICAL CARE IF:   You develop increased pain, swelling, redness, drainage, or bleeding in the wound site.   You develop signs of generalized infection including muscle aches, chills, fever, or a general ill feeling.   You have an oral temperature above 102 F (38.9 C).  MAKE SURE YOU:   Understand these instructions.   Will watch your condition.   Will get help right away if you are not doing well or get worse.  Document Released: 09/08/2005 Document Revised: 11/18/2011 Document Reviewed: 07/02/2008 [facility] Patient Information 2012 [place], [place].

## 2012-03-20 NOTE — ED Notes (Signed)
Pt reports he noted abscess to lower central abdomen/groin yesterday- area is red, swollen and non- draining. Pt denies fever or chills. Pt reports pain is 10/10. Pt denies taking anything for pain. Pt reports history of abscess in this area before. Pt also concerned for constipation. Last BM was Saturday.

## 2012-03-21 NOTE — ED Provider Notes (Signed)
Medical screening examination/treatment/procedure(s) were performed by non-physician practitioner and as supervising physician I was immediately available for consultation/collaboration.  Meng Winterton T Emery Dupuy, MD 03/21/12 0757 

## 2012-03-22 ENCOUNTER — Emergency Department (HOSPITAL_COMMUNITY)
Admission: EM | Admit: 2012-03-22 | Discharge: 2012-03-22 | Disposition: A | Discharge status: Home / Self Care | Payer: 59 | Attending: Emergency Medicine | Admitting: Emergency Medicine

## 2012-03-22 ENCOUNTER — Encounter (HOSPITAL_COMMUNITY): Payer: Self-pay | Admitting: *Deleted

## 2012-03-22 DIAGNOSIS — L0291 Cutaneous abscess, unspecified: Secondary | ICD-10-CM

## 2012-03-22 DIAGNOSIS — L02219 Cutaneous abscess of trunk, unspecified: Secondary | ICD-10-CM | POA: Insufficient documentation

## 2012-03-22 DIAGNOSIS — Z87891 Personal history of nicotine dependence: Secondary | ICD-10-CM | POA: Insufficient documentation

## 2012-03-22 NOTE — Discharge Instructions (Signed)
Please read the information below.  Continue to take the antibiotics until they are gone.  Keep the wound clean and covered with gauze until it is healed.  Return to the ER immediately if you develop redness, swelling, pus draining from the wound, or fevers greater than 100.4.  You may return to the ER at any time for worsening condition or any new symptoms that concern you.  Abscess Care After An abscess (also called a boil or furuncle) is an infected area that contains a collection of pus. Signs and symptoms of an abscess include pain, tenderness, redness, or hardness, or you may feel a moveable soft area under your skin. An abscess can occur anywhere in the body. The infection may spread to surrounding tissues causing cellulitis. A cut (incision) by the surgeon was made over your abscess and the pus was drained out. Gauze may have been packed into the space to provide a drain that will allow the cavity to heal from the inside outwards. The boil may be painful for 5 to 7 days. Most people with a boil do not have high fevers. Your abscess, if seen early, may not have localized, and may not have been lanced. If not, another appointment may be required for this if it does not get better on its own or with medications. HOME CARE INSTRUCTIONS   Only take over-the-counter or prescription medicines for pain, discomfort, or fever as directed by your caregiver.   When you bathe, soak and then remove gauze or iodoform packs at least daily or as directed by your caregiver. You may then wash the wound gently with mild soapy water. Repack with gauze or do as your caregiver directs.  SEEK IMMEDIATE MEDICAL CARE IF:   You develop increased pain, swelling, redness, drainage, or bleeding in the wound site.   You develop signs of generalized infection including muscle aches, chills, fever, or a general ill feeling.   An oral temperature above 102 F (38.9 C) develops, not controlled by medication.  See your  caregiver for a recheck if you develop any of the symptoms described above. If medications (antibiotics) were prescribed, take them as directed. Document Released: 06/17/2005 Document Revised: 11/18/2011 Document Reviewed: 02/12/2008 [facility] Patient Information 2012 [place], [place].

## 2012-03-22 NOTE — ED Notes (Signed)
Dressing changed.

## 2012-03-22 NOTE — ED Provider Notes (Signed)
Medical screening examination/treatment/procedure(s) were performed by non-physician practitioner and as supervising physician I was immediately available for consultation/collaboration.  Ethelda Chick, MD 03/22/12 [phone]

## 2012-03-22 NOTE — ED Notes (Signed)
Pt states "here for a follow up for the abscess on my stomach, here to get packing removed"

## 2012-03-22 NOTE — ED Provider Notes (Signed)
History     CSN: [id number]  Arrival date & time 03/22/12  1127   First MD Initiated Contact with Patient 03/22/12 1228      Chief Complaint  Patient presents with  . Abscess    packing removal    (Consider location/radiation/quality/duration/timing/severity/associated sxs/prior treatment) HPI Comments: Patient returns to the emergency department today for recheck of his abscess which was incised and drained two days ago.  States it is his been doing well.  He has had no problems.  Denies fevers, redness, swelling, increased drainage.  He has left the packing intact.  Patient is a 41 y.o. male presenting with abscess. The history is provided by the patient.  Abscess  Pertinent negatives include no fever, no diarrhea and no vomiting.    Past Medical History  Diagnosis Date  . Pneumothorax, left   . Tobacco abuse     History reviewed. No pertinent past surgical history.  Family History  Problem Relation Age of Onset  . Cancer Father 56    throat    History  Substance Use Topics  . Smoking status: Former Smoker -- 1.0 packs/day for 20 years    Types: Cigarettes  . Smokeless tobacco: Not on file  . Alcohol Use: No      Review of Systems  Constitutional: Negative for fever.  Respiratory: Negative for shortness of breath.   Cardiovascular: Negative for chest pain.  Gastrointestinal: Negative for nausea, vomiting, abdominal pain, diarrhea and constipation.  Genitourinary: Negative for dysuria.  All other systems reviewed and are negative.    Allergies  Sulfa antibiotics  Home Medications   Current Outpatient Rx  Name Route Sig Dispense Refill  . CEPHALEXIN 500 MG PO CAPS Oral Take 1 capsule (500 mg total) by mouth 4 (four) times daily. 28 capsule 0  . HYDROCODONE-ACETAMINOPHEN 5-500 MG PO TABS Oral Take 1-2 tablets by mouth every 6 (six) hours as needed for pain. 10 tablet 0  . IBUPROFEN 200 MG PO TABS Oral Take 400 mg by mouth every 6 (six) hours as needed.  pain      BP 131/78  Pulse 90  Temp(Src) 98.3 F (36.8 C) (Oral)  Resp 14  Ht 6' (1.829 m)  Wt 170 lb (77.111 kg)  BMI 23.06 kg/m2  SpO2 98%  Physical Exam  Nursing note and vitals reviewed. Constitutional: He is oriented to person, place, and time. He appears well-developed and well-nourished.  HENT:  Head: Normocephalic and atraumatic.  Neck: Neck supple.  Pulmonary/Chest: Effort normal.  Neurological: He is alert and oriented to person, place, and time.  Skin:          No erythema, edema, warmth, tenderness.  Base of wound with healthy granulation tissue.  Psychiatric: He has a normal mood and affect. His behavior is normal. Judgment and thought content normal.    ED Course  Procedures (including critical care time)  Labs Reviewed - No data to display No results found.  Packing removal performed by me.  Base of wound is healthy appearing.  No active drainage.     1. Abscess       MDM  Patient returns to the emergency department for packing removal from abscess.  Patient has been taking Keflex and has not had any problems with his wound.  The base of the wound is healthy.  There is no active drainage.  Patient has not had fevers.  I did not repack the abscess.  I discussed wound care with the patient as  well as return precautions.  Patient verbalizes understanding and agrees with plan.          Dillard Cannon [place], [place] 03/22/12 [phone]

## 2012-08-10 ENCOUNTER — Other Ambulatory Visit: Payer: Self-pay | Admitting: Internal Medicine

## 2012-08-10 DIAGNOSIS — K5900 Constipation, unspecified: Secondary | ICD-10-CM

## 2012-08-10 DIAGNOSIS — R109 Unspecified abdominal pain: Secondary | ICD-10-CM

## 2012-08-10 DIAGNOSIS — R140 Abdominal distension (gaseous): Secondary | ICD-10-CM

## 2012-08-18 ENCOUNTER — Other Ambulatory Visit: Payer: Self-pay | Admitting: Internal Medicine

## 2012-08-18 ENCOUNTER — Ambulatory Visit
Admission: RE | Admit: 2012-08-18 | Discharge: 2012-08-18 | Disposition: A | Discharge status: Home / Self Care | Payer: 59 | Source: Ambulatory Visit | Attending: Internal Medicine | Admitting: Internal Medicine

## 2012-08-18 DIAGNOSIS — R911 Solitary pulmonary nodule: Secondary | ICD-10-CM

## 2012-08-18 DIAGNOSIS — R140 Abdominal distension (gaseous): Secondary | ICD-10-CM

## 2012-08-18 DIAGNOSIS — R109 Unspecified abdominal pain: Secondary | ICD-10-CM

## 2012-08-18 DIAGNOSIS — K5900 Constipation, unspecified: Secondary | ICD-10-CM

## 2012-08-22 ENCOUNTER — Ambulatory Visit
Admission: RE | Admit: 2012-08-22 | Discharge: 2012-08-22 | Disposition: A | Discharge status: Home / Self Care | Payer: 59 | Source: Ambulatory Visit | Attending: Internal Medicine | Admitting: Internal Medicine

## 2012-08-22 DIAGNOSIS — R911 Solitary pulmonary nodule: Secondary | ICD-10-CM

## 2014-06-26 ENCOUNTER — Other Ambulatory Visit: Payer: Self-pay | Admitting: Internal Medicine

## 2014-06-26 ENCOUNTER — Ambulatory Visit
Admission: RE | Admit: 2014-06-26 | Discharge: 2014-06-26 | Disposition: A | Discharge status: Home / Self Care | Payer: 59 | Source: Ambulatory Visit | Attending: Internal Medicine | Admitting: Internal Medicine

## 2014-06-26 DIAGNOSIS — R109 Unspecified abdominal pain: Secondary | ICD-10-CM

## 2015-04-12 ENCOUNTER — Encounter (HOSPITAL_COMMUNITY): Payer: Self-pay | Admitting: Emergency Medicine

## 2015-04-12 ENCOUNTER — Emergency Department (HOSPITAL_COMMUNITY)
Admission: EM | Admit: 2015-04-12 | Discharge: 2015-04-12 | Disposition: A | Discharge status: Home / Self Care | Payer: BLUE CROSS/BLUE SHIELD | Attending: Emergency Medicine | Admitting: Emergency Medicine

## 2015-04-12 ENCOUNTER — Emergency Department (HOSPITAL_COMMUNITY): Payer: BLUE CROSS/BLUE SHIELD

## 2015-04-12 DIAGNOSIS — R05 Cough: Secondary | ICD-10-CM | POA: Insufficient documentation

## 2015-04-12 DIAGNOSIS — Z8709 Personal history of other diseases of the respiratory system: Secondary | ICD-10-CM | POA: Insufficient documentation

## 2015-04-12 DIAGNOSIS — Z8701 Personal history of pneumonia (recurrent): Secondary | ICD-10-CM | POA: Insufficient documentation

## 2015-04-12 DIAGNOSIS — R079 Chest pain, unspecified: Secondary | ICD-10-CM

## 2015-04-12 DIAGNOSIS — Z87891 Personal history of nicotine dependence: Secondary | ICD-10-CM | POA: Insufficient documentation

## 2015-04-12 DIAGNOSIS — Z872 Personal history of diseases of the skin and subcutaneous tissue: Secondary | ICD-10-CM | POA: Diagnosis not present

## 2015-04-12 DIAGNOSIS — Z8719 Personal history of other diseases of the digestive system: Secondary | ICD-10-CM | POA: Diagnosis not present

## 2015-04-12 DIAGNOSIS — R059 Cough, unspecified: Secondary | ICD-10-CM

## 2015-04-12 LAB — CBC
HCT: 42.3 % (ref 39.0–52.0)
Hemoglobin: 14.4 g/dL (ref 13.0–17.0)
MCH: 31.9 pg (ref 26.0–34.0)
MCHC: 34 g/dL (ref 30.0–36.0)
MCV: 93.8 fL (ref 78.0–100.0)
Platelets: 273 10*3/uL (ref 150–400)
RBC: 4.51 MIL/uL (ref 4.22–5.81)
RDW: 12.6 % (ref 11.5–15.5)
WBC: 11.8 10*3/uL — ABNORMAL HIGH (ref 4.0–10.5)

## 2015-04-12 LAB — BASIC METABOLIC PANEL
Anion gap: 8 (ref 5–15)
BUN: 9 mg/dL (ref 6–23)
CO2: 27 mmol/L (ref 19–32)
Calcium: 9 mg/dL (ref 8.4–10.5)
Chloride: 104 mmol/L (ref 96–112)
Creatinine, Ser: 0.93 mg/dL (ref 0.50–1.35)
GFR calc Af Amer: >90 mL/min (ref >90)
GFR calc non Af Amer: >90 mL/min (ref >90)
Glucose, Bld: 93 mg/dL (ref 70–99)
Potassium: 3.7 mmol/L (ref 3.5–5.1)
Sodium: 139 mmol/L (ref 135–145)

## 2015-04-12 LAB — I-STAT TROPONIN, ED: Troponin i, poc: 0.01 ng/mL (ref 0.00–0.08)

## 2015-04-12 MED ORDER — ALBUTEROL SULFATE HFA 108 (90 BASE) MCG/ACT IN AERS
1.0000 | INHALATION_SPRAY | RESPIRATORY_TRACT | Status: DC | PRN
  Administered 2015-04-12: 2 via RESPIRATORY_TRACT
  Filled 2015-04-12: qty 6.7

## 2015-04-12 MED ORDER — NAPROXEN 250 MG PO TABS
500.0000 mg | ORAL_TABLET | Freq: Once | ORAL | Status: AC
  Administered 2015-04-12: 500 mg via ORAL
  Filled 2015-04-12: qty 2

## 2015-04-12 MED ORDER — OXYCODONE-ACETAMINOPHEN 5-325 MG PO TABS
1.0000 | ORAL_TABLET | Freq: Once | ORAL | Status: AC
  Administered 2015-04-12: 1 via ORAL
  Filled 2015-04-12: qty 1

## 2015-04-12 MED ORDER — OXYCODONE-ACETAMINOPHEN 5-325 MG PO TABS: 1.0000 | ORAL_TABLET | ORAL | Status: DC | PRN

## 2015-04-12 MED ORDER — NAPROXEN 500 MG PO TABS: 500.0000 mg | ORAL_TABLET | Freq: Two times a day (BID) | ORAL | Status: DC

## 2015-04-12 NOTE — ED Provider Notes (Signed)
CSN: [id number]     Arrival date & time 04/12/15  1444 History   First MD Initiated Contact with Patient 04/12/15 1734     Chief Complaint  Patient presents with  . Chest Pain     (Consider location/radiation/quality/duration/timing/severity/associated sxs/prior Treatment) Patient is a 44 y.o. male presenting with chest pain. The history is provided by the patient and medical records.  Chest Pain Associated symptoms: cough     This is a 44 year old male with past medical history significant for constipation, presenting to the ED for chest pain. The past 2 days patient has been experiencing constant, aching pain in the right side of his chest. Pain is worse with coughing and deep breathing. Patient has recently been battling with the flu for the past 4 days and has had a continuous dry cough. He denies any shortness of breath. Patient has no known cardiac history or family cardiac history. He is a daily smoker, approximately half pack per day. He denies any recent travel, lower extremity edema, calf pain, trauma, or surgeries. He has no history of DVT or PE. No intervention tried prior to arrival.  Past Medical History  Diagnosis Date  . Pneumothorax, left   . Tobacco abuse   . Pneumonia 2012  . Keloid     h/o keloid, after boil removed form groin right. boil on stomach  . Constipation   . Bloating    Past Surgical History  Procedure Laterality Date  . Chest tube insertion  2004    PTX - chest tube   Family History  Problem Relation Age of Onset  . Cancer Father 56    throat  . Hypertension Mother     Pul HTN   History  Substance Use Topics  . Smoking status: Former Smoker -- 1.00 packs/day for 20 years    Types: Cigarettes  . Smokeless tobacco: Not on file  . Alcohol Use: No    Review of Systems  Respiratory: Positive for cough.   Cardiovascular: Positive for chest pain.  All other systems reviewed and are negative.     Allergies  Sulfa antibiotics  Home  Medications   Prior to Admission medications   Not on File   BP 128/78 mmHg  Pulse 81  Temp(Src) 98.8 F (37.1 C) (Oral)  Resp 18  Ht 6' (1.829 m)  Wt 190 lb (86.183 kg)  BMI 25.76 kg/m2  SpO2 99%   Physical Exam  Constitutional: He is oriented to person, place, and time. He appears well-developed and well-nourished. No distress.  HENT:  Head: Normocephalic and atraumatic.  Mouth/Throat: Oropharynx is clear and moist.  Eyes: Conjunctivae and EOM are normal. Pupils are equal, round, and reactive to light.  Neck: Normal range of motion. Neck supple.  Cardiovascular: Normal rate, regular rhythm and normal heart sounds.   Pulmonary/Chest: Effort normal and breath sounds normal. No respiratory distress. He has no wheezes.  Abdominal: Soft. Bowel sounds are normal. There is no tenderness. There is no guarding.  Musculoskeletal: Normal range of motion. He exhibits no edema.  Neurological: He is alert and oriented to person, place, and time.  Skin: Skin is warm and dry. He is not diaphoretic.  Psychiatric: He has a normal mood and affect.  Nursing note and vitals reviewed.   ED Course  Procedures (including critical care time) Labs Review Labs Reviewed  CBC - Abnormal; Notable for the following:    WBC 11.8 (*)    All other components within normal limits  BASIC  METABOLIC PANEL  I-STAT TROPOININ, ED    Imaging Review Dg Chest 2 View (if Patient Has Fever And/or Copd)  04/12/2015   CLINICAL DATA:  Right chest pain  EXAM: CHEST  2 VIEW  COMPARISON:  None.  FINDINGS: Mild scarring in the right upper lobe. Lungs are otherwise clear. No pleural effusion or pneumothorax.  The heart is normal in size.  Visualized osseous structures are within normal limits.  IMPRESSION: No evidence of acute cardiopulmonary disease.   Electronically Signed   By: Charline Bills M.D.   On: 04/12/2015 15:27     EKG Interpretation None      MDM   Final diagnoses:  Chest pain, unspecified chest  pain type  Cough   44 year old male with cough and right-sided chest pain for the past 2 days. Pain is constant without radiation. He denies associated shortness of breath. He has been recently battling with blue. On exam, patient is afebrile and nontoxic in appearance. He is in no acute respiratory distress. His chest wall is nontender and there are no noted deformities. His EKG is sinus rhythm without acute ischemic changes. Reassuring, troponin negative. Chest x-ray is clear. Patient has no clinical signs of DVT and is PERC negative.  Given negative work-up here in the ED after 2 days of constant pain, lower suspicion for ACS, PE, dissection, or other acute cardiac event at this time.  Pain seems more pleuritic in nature, esp with his recent cough.  Will start on pain meds, anti-inflammatories, and albuterol inhaler for PRN use-- first dose of meds given in ED.  Patient will FU with his PCP. I've also recommended smoking cessation and encouraged him to speak with his primary care physician about treatment options.  Discussed plan with patient, he/she acknowledged understanding and agreed with plan of care.  Return precautions given for new or worsening symptoms.  Garlon Hatchet, PA-C 04/12/15 1910  Arby Barrette, MD 04/14/15 [phone]

## 2015-04-12 NOTE — Discharge Instructions (Signed)
Take the prescribed medication as directed.  Use inhaler as needed if you begin to feel short of breath or begin wheezing. Follow-up with your primary care physician --> recommend to discuss treatment options for smoking cessation as well. Return to the ED for new or worsening symptoms.

## 2015-04-12 NOTE — ED Notes (Signed)
Pt c/o right sided chest pain x 2 days. Pt reports painful to breath.

## 2017-07-14 DIAGNOSIS — R350 Frequency of micturition: Secondary | ICD-10-CM | POA: Diagnosis not present

## 2017-08-17 ENCOUNTER — Other Ambulatory Visit: Payer: Self-pay | Admitting: Internal Medicine

## 2017-08-17 DIAGNOSIS — R1909 Other intra-abdominal and pelvic swelling, mass and lump: Secondary | ICD-10-CM | POA: Diagnosis not present

## 2017-08-17 DIAGNOSIS — Z125 Encounter for screening for malignant neoplasm of prostate: Secondary | ICD-10-CM | POA: Diagnosis not present

## 2017-08-17 DIAGNOSIS — Z0000 Encounter for general adult medical examination without abnormal findings: Secondary | ICD-10-CM | POA: Diagnosis not present

## 2017-08-17 DIAGNOSIS — E7800 Pure hypercholesterolemia, unspecified: Secondary | ICD-10-CM | POA: Diagnosis not present

## 2017-08-17 DIAGNOSIS — F17210 Nicotine dependence, cigarettes, uncomplicated: Secondary | ICD-10-CM | POA: Diagnosis not present

## 2017-08-19 ENCOUNTER — Ambulatory Visit
Admission: RE | Admit: 2017-08-19 | Discharge: 2017-08-19 | Disposition: A | Discharge status: Home / Self Care | Payer: BLUE CROSS/BLUE SHIELD | Source: Ambulatory Visit | Attending: Internal Medicine | Admitting: Internal Medicine

## 2017-08-19 DIAGNOSIS — R1909 Other intra-abdominal and pelvic swelling, mass and lump: Secondary | ICD-10-CM | POA: Diagnosis not present

## 2017-08-19 MED ORDER — IOPAMIDOL (ISOVUE-300) INJECTION 61%
100.0000 mL | Freq: Once | INTRAVENOUS | Status: AC | PRN
  Administered 2017-08-19: 100 mL via INTRAVENOUS

## 2017-09-23 DIAGNOSIS — E7800 Pure hypercholesterolemia, unspecified: Secondary | ICD-10-CM | POA: Diagnosis not present

## 2017-09-23 DIAGNOSIS — R599 Enlarged lymph nodes, unspecified: Secondary | ICD-10-CM | POA: Diagnosis not present

## 2018-06-15 ENCOUNTER — Other Ambulatory Visit: Payer: Self-pay

## 2018-06-15 ENCOUNTER — Encounter (HOSPITAL_COMMUNITY): Payer: Self-pay | Admitting: Emergency Medicine

## 2018-06-15 ENCOUNTER — Ambulatory Visit (HOSPITAL_COMMUNITY)
Admission: EM | Admit: 2018-06-15 | Discharge: 2018-06-15 | Disposition: A | Discharge status: Home / Self Care | Payer: BLUE CROSS/BLUE SHIELD | Attending: Urgent Care | Admitting: Urgent Care

## 2018-06-15 ENCOUNTER — Ambulatory Visit (INDEPENDENT_AMBULATORY_CARE_PROVIDER_SITE_OTHER): Payer: BLUE CROSS/BLUE SHIELD

## 2018-06-15 DIAGNOSIS — F17200 Nicotine dependence, unspecified, uncomplicated: Secondary | ICD-10-CM

## 2018-06-15 DIAGNOSIS — R0789 Other chest pain: Secondary | ICD-10-CM

## 2018-06-15 DIAGNOSIS — R109 Unspecified abdominal pain: Secondary | ICD-10-CM

## 2018-06-15 DIAGNOSIS — R319 Hematuria, unspecified: Secondary | ICD-10-CM

## 2018-06-15 DIAGNOSIS — R079 Chest pain, unspecified: Secondary | ICD-10-CM | POA: Diagnosis not present

## 2018-06-15 DIAGNOSIS — Z720 Tobacco use: Secondary | ICD-10-CM

## 2018-06-15 HISTORY — DX: Pure hypercholesterolemia, unspecified: E78.00

## 2018-06-15 LAB — POCT URINALYSIS DIP (DEVICE)
Bilirubin Urine: NEGATIVE
Glucose, UA: NEGATIVE mg/dL
Ketones, ur: NEGATIVE mg/dL
Leukocytes, UA: NEGATIVE
Nitrite: NEGATIVE
Protein, ur: NEGATIVE mg/dL
Specific Gravity, Urine: 1.02 (ref 1.005–1.030)
Urobilinogen, UA: 0.2 mg/dL (ref 0.0–1.0)
pH: 6.5 (ref 5.0–8.0)

## 2018-06-15 LAB — POCT I-STAT, CHEM 8
BUN: 13 mg/dL (ref 6–20)
Calcium, Ion: 1.19 mmol/L (ref 1.15–1.40)
Chloride: 101 mmol/L (ref 98–111)
Creatinine, Ser: 0.9 mg/dL (ref 0.61–1.24)
Glucose, Bld: 102 mg/dL — ABNORMAL HIGH (ref 70–99)
HCT: 44 % (ref 39.0–52.0)
Hemoglobin: 15 g/dL (ref 13.0–17.0)
Potassium: 3.8 mmol/L (ref 3.5–5.1)
Sodium: 140 mmol/L (ref 135–145)
TCO2: 27 mmol/L (ref 22–32)

## 2018-06-15 MED ORDER — KETOROLAC TROMETHAMINE 60 MG/2ML IM SOLN
60.0000 mg | Freq: Once | INTRAMUSCULAR | Status: AC
  Administered 2018-06-15: 60 mg via INTRAMUSCULAR

## 2018-06-15 MED ORDER — KETOROLAC TROMETHAMINE 60 MG/2ML IM SOLN
INTRAMUSCULAR | Status: AC
  Filled 2018-06-15: qty 2

## 2018-06-15 MED ORDER — TRAMADOL-ACETAMINOPHEN 37.5-325 MG PO TABS: 1.0000 | ORAL_TABLET | Freq: Four times a day (QID) | ORAL | 0 refills | Status: AC | PRN

## 2018-06-15 NOTE — ED Triage Notes (Signed)
Right epigastric pain, right mid back pain for 2 days.  Worse with movement.  Denies nausea, no vomiting, no sob.

## 2018-06-15 NOTE — Discharge Instructions (Addendum)
Hydrate well with at least 2 liters (1 gallon) of water daily. You may take 500mg  Tylenol every 6 hours for pain and inflammation. Take Ultracet in addition to Tylenol if your pain persists.

## 2018-06-15 NOTE — ED Provider Notes (Signed)
MRN: [id number] DOB: November 24, 1971  Subjective:   Wesley Alvarez is a 47 y.o. male presenting for 2-day history of right low chest pain, right flank pain.  Patient reports that the pain is mostly constant, sharp, rates it 7 out of 10, worse with twisting motions.  He tried Aleve with some relief.  Denies history of renal stone, fever, dysuria, hematuria, urinary frequency, nausea, vomiting, trauma, falls, heavy lifting or strenuous activity of the back, shob.  Smokes 1/2 pack/day. Has a history of left-sided pneumothorax in 2004. Does not drink water. Drinks green tea, ginger ale.    No current facility-administered medications for this encounter.   Current Outpatient Medications:  .  NON FORMULARY, High cholesterol medicine, Disp: , Rfl:     Allergies  Allergen Reactions  . Sulfa Antibiotics Itching    Past Medical History:  Diagnosis Date  . Bloating   . Constipation   . High cholesterol   . Keloid    h/o keloid, after boil removed form groin right. boil on stomach  . Pneumonia 2012  . Pneumothorax, left   . Tobacco abuse      Past Surgical History:  Procedure Laterality Date  . CHEST TUBE INSERTION  2004   PTX - chest tube    Objective:   Vitals: BP 134/88 (BP Location: Left Arm)   Pulse 77   Temp 98.3 F (36.8 C) (Oral)   Resp 20   SpO2 98%   Physical Exam  Constitutional: He is oriented to person, place, and time. He appears well-developed and well-nourished.  HENT:  Mouth/Throat: Oropharynx is clear and moist.  Eyes: Right eye exhibits no discharge. Left eye exhibits no discharge. No scleral icterus.  Cardiovascular: Normal rate, regular rhythm, normal heart sounds and intact distal pulses. Exam reveals no gallop and no friction rub.  No murmur heard. Pulmonary/Chest: Effort normal and breath sounds normal. No stridor. No respiratory distress. He has no wheezes. He has no rales. He exhibits no tenderness.  Abdominal: Soft. Bowel sounds are normal. He exhibits no  distension and no mass. There is no tenderness. There is no rebound and no guarding.  Right-sided CVA tenderness even with light palpation.  Neurological: He is alert and oriented to person, place, and time.  Skin: Skin is warm and dry.  Psychiatric: He has a normal mood and affect.   Dg Chest 2 View  Result Date: 06/15/2018 CLINICAL DATA:  Chest pain for 2 days EXAM: CHEST - 2 VIEW COMPARISON:  04/12/2015 FINDINGS: Cardiac shadow is within normal limits. The lungs are well aerated bilaterally. Emphysematous changes are noted most marked in the right apex stable from the prior exam. No focal infiltrate or sizable effusion is seen. No bony abnormality is noted. IMPRESSION: COPD without acute abnormality. Electronically Signed   By: Alcide Clever M.D.   On: 06/15/2018 12:22    Results for orders placed or performed during the hospital encounter of 06/15/18 (from the past 24 hour(s))  POCT urinalysis dip (device)     Status: Abnormal   Collection Time: 06/15/18 11:58 AM  Result Value Ref Range   Glucose, UA NEGATIVE NEGATIVE mg/dL   Bilirubin Urine NEGATIVE NEGATIVE   Ketones, ur NEGATIVE NEGATIVE mg/dL   Specific Gravity, Urine 1.020 1.005 - 1.030   Hgb urine dipstick TRACE (A) NEGATIVE   pH 6.5 5.0 - 8.0   Protein, ur NEGATIVE NEGATIVE mg/dL   Urobilinogen, UA 0.2 0.0 - 1.0 mg/dL   Nitrite NEGATIVE NEGATIVE   Leukocytes,  UA NEGATIVE NEGATIVE  I-STAT, chem 8     Status: Abnormal   Collection Time: 06/15/18 12:08 PM  Result Value Ref Range   Sodium 140 135 - 145 mmol/L   Potassium 3.8 3.5 - 5.1 mmol/L   Chloride 101 98 - 111 mmol/L   BUN 13 6 - 20 mg/dL   Creatinine, Ser 4.090.90 0.61 - 1.24 mg/dL   Glucose, Bld 811102 (H) 70 - 99 mg/dL   Calcium, Ion 9.141.19 7.821.15 - 1.40 mmol/L   TCO2 27 22 - 32 mmol/L   Hemoglobin 15.0 13.0 - 17.0 g/dL   HCT 95.644.0 21.339.0 - 08.652.0 %   ED ECG REPORT   Date: 06/15/2018  Rate: 66 bpm  Rhythm: normal sinus rhythm  QRS Axis: normal  Intervals: normal  ST/T Wave  abnormalities: normal  Conduction Disutrbances:none  Narrative Interpretation: Sinus rhythm at 66 bpm.  Old EKG Reviewed: Very comparable to ECG from 04/12/2015  I have personally reviewed the EKG tracing and agree with the computerized printout as noted.  Assessment and Plan :   Atypical chest pain  Tobacco use disorder  Right flank pain  Hematuria, unspecified type  We will manage patient for renal colic with aggressive hydration and Ultracet.  Patient has sulfa drugs listed as allergy so we cannot do Flomax.  Return to clinic precautions reviewed.   Wallis BambergMani, Zelena Bushong, [place] JerseyPA-C 06/15/18 1241

## 2018-06-16 ENCOUNTER — Other Ambulatory Visit: Payer: Self-pay | Admitting: Nurse Practitioner

## 2018-06-16 DIAGNOSIS — R100 Acute abdomen: Secondary | ICD-10-CM

## 2018-06-16 DIAGNOSIS — E7800 Pure hypercholesterolemia, unspecified: Secondary | ICD-10-CM | POA: Diagnosis not present

## 2018-06-16 DIAGNOSIS — F17210 Nicotine dependence, cigarettes, uncomplicated: Secondary | ICD-10-CM | POA: Diagnosis not present

## 2018-06-16 DIAGNOSIS — N029 Recurrent and persistent hematuria with unspecified morphologic changes: Secondary | ICD-10-CM | POA: Diagnosis not present

## 2018-06-17 ENCOUNTER — Encounter (HOSPITAL_COMMUNITY): Payer: Self-pay

## 2018-06-17 ENCOUNTER — Emergency Department (HOSPITAL_COMMUNITY): Payer: BLUE CROSS/BLUE SHIELD

## 2018-06-17 ENCOUNTER — Emergency Department (HOSPITAL_COMMUNITY)
Admission: EM | Admit: 2018-06-17 | Discharge: 2018-06-17 | Disposition: A | Discharge status: Home / Self Care | Payer: BLUE CROSS/BLUE SHIELD | Attending: Emergency Medicine | Admitting: Emergency Medicine

## 2018-06-17 DIAGNOSIS — F17210 Nicotine dependence, cigarettes, uncomplicated: Secondary | ICD-10-CM | POA: Insufficient documentation

## 2018-06-17 DIAGNOSIS — R319 Hematuria, unspecified: Secondary | ICD-10-CM | POA: Diagnosis not present

## 2018-06-17 DIAGNOSIS — K5900 Constipation, unspecified: Secondary | ICD-10-CM | POA: Insufficient documentation

## 2018-06-17 DIAGNOSIS — R110 Nausea: Secondary | ICD-10-CM | POA: Diagnosis not present

## 2018-06-17 DIAGNOSIS — R1011 Right upper quadrant pain: Secondary | ICD-10-CM | POA: Diagnosis not present

## 2018-06-17 DIAGNOSIS — M549 Dorsalgia, unspecified: Secondary | ICD-10-CM | POA: Insufficient documentation

## 2018-06-17 DIAGNOSIS — Z79899 Other long term (current) drug therapy: Secondary | ICD-10-CM | POA: Diagnosis not present

## 2018-06-17 LAB — CBC
HCT: 44.8 % (ref 39.0–52.0)
Hemoglobin: 15.1 g/dL (ref 13.0–17.0)
MCH: 32.1 pg (ref 26.0–34.0)
MCHC: 33.7 g/dL (ref 30.0–36.0)
MCV: 95.1 fL (ref 78.0–100.0)
Platelets: 253 10*3/uL (ref 150–400)
RBC: 4.71 MIL/uL (ref 4.22–5.81)
RDW: 13.2 % (ref 11.5–15.5)
WBC: 10 10*3/uL (ref 4.0–10.5)

## 2018-06-17 LAB — URINALYSIS, ROUTINE W REFLEX MICROSCOPIC
Bacteria, UA: NONE SEEN
Bilirubin Urine: NEGATIVE
Glucose, UA: NEGATIVE mg/dL
Ketones, ur: NEGATIVE mg/dL
Nitrite: NEGATIVE
Protein, ur: NEGATIVE mg/dL
Specific Gravity, Urine: 1.009 (ref 1.005–1.030)
pH: 6 (ref 5.0–8.0)

## 2018-06-17 LAB — COMPREHENSIVE METABOLIC PANEL
ALT: 13 U/L (ref 0–44)
AST: 16 U/L (ref 15–41)
Albumin: 3.6 g/dL (ref 3.5–5.0)
Alkaline Phosphatase: 62 U/L (ref 38–126)
Anion gap: 7 (ref 5–15)
BUN: 9 mg/dL (ref 6–20)
CO2: 27 mmol/L (ref 22–32)
Calcium: 9.2 mg/dL (ref 8.9–10.3)
Chloride: 105 mmol/L (ref 98–111)
Creatinine, Ser: 0.94 mg/dL (ref 0.61–1.24)
GFR calc Af Amer: >60 mL/min (ref >60)
GFR calc non Af Amer: >60 mL/min (ref >60)
Glucose, Bld: 100 mg/dL — ABNORMAL HIGH (ref 70–99)
Potassium: 3.8 mmol/L (ref 3.5–5.1)
Sodium: 139 mmol/L (ref 135–145)
Total Bilirubin: 1 mg/dL (ref 0.3–1.2)
Total Protein: 7.2 g/dL (ref 6.5–8.1)

## 2018-06-17 LAB — LIPASE, BLOOD: Lipase: 25 U/L (ref 11–51)

## 2018-06-17 MED ORDER — DICYCLOMINE HCL 20 MG PO TABS: 20.0000 mg | ORAL_TABLET | Freq: Two times a day (BID) | ORAL | 0 refills | Status: DC

## 2018-06-17 MED ORDER — FAMOTIDINE 20 MG PO TABS: 20.0000 mg | ORAL_TABLET | Freq: Two times a day (BID) | ORAL | 0 refills | Status: AC

## 2018-06-17 MED ORDER — FAMOTIDINE 20 MG PO TABS: 20.0000 mg | ORAL_TABLET | Freq: Two times a day (BID) | ORAL | 0 refills | Status: DC

## 2018-06-17 MED ORDER — SODIUM CHLORIDE 0.9 % IV BOLUS
1000.0000 mL | Freq: Once | INTRAVENOUS | Status: AC
  Administered 2018-06-17: 1000 mL via INTRAVENOUS

## 2018-06-17 MED ORDER — KETOROLAC TROMETHAMINE 30 MG/ML IJ SOLN
30.0000 mg | Freq: Once | INTRAMUSCULAR | Status: AC
Start: 2018-06-17 — End: 2018-06-17
  Administered 2018-06-17: 30 mg via INTRAVENOUS
  Filled 2018-06-17: qty 1

## 2018-06-17 MED ORDER — POLYETHYLENE GLYCOL 3350 17 G PO PACK: 17.0000 g | PACK | Freq: Every day | ORAL | 0 refills | Status: AC

## 2018-06-17 MED ORDER — MORPHINE SULFATE (PF) 4 MG/ML IV SOLN
4.0000 mg | Freq: Once | INTRAVENOUS | Status: AC
  Administered 2018-06-17: 4 mg via INTRAVENOUS
  Filled 2018-06-17: qty 1

## 2018-06-17 NOTE — ED Triage Notes (Signed)
Onset 06-13-18 right flank pain radiating to RUQ.  Seen at u/c 06-15-18, trace of blood in urine was prescribed Tramadol.  Pt seen at PCP for same, was told it may be gallbladder.  U/s scheduled for 06-21-18.  Pain has not improved with meds.

## 2018-06-17 NOTE — ED Provider Notes (Signed)
[facility] Provider [facility]   CSN: [id number] Arrival date & time: 06/17/18  1201     History   Chief Complaint Chief Complaint  Patient presents with  . Flank Pain    HPI Wesley Alvarez is a 47 y.o. male who presents to ED for evaluation of 4-day history of right upper quadrant abdominal pain, right flank pain and right back pain.  He was initially seen by urgent care and was prescribed tramadol for possible kidney stone after he was found to have hematuria.  He was then seen by his primary care provider yesterday who was concerned about gallbladder pathology.  He has a right upper quadrant ultrasound scheduled in 4 days.  However, he is here because he has ongoing pain that is not relieved with tramadol.  He reports associated constipation" straining when I poop."  Had a bowel movement yesterday.  Reports nausea with no vomiting.  No sick contacts with similar symptoms.  He denies any chest pain or shortness of breath but states that the pain is worse with movement and breathing.  Denies any dysuria, gross hematuria, hemoptysis, hematochezia or melena, prior kidney stones, prior abdominal surgeries, testicular complaints.  He reports daily tobacco, alcohol and marijuana use.  Denies chronic NSAID use.  HPI  Past Medical History:  Diagnosis Date  . Bloating   . Constipation   . High cholesterol   . Keloid    h/o keloid, after boil removed form groin right. boil on stomach  . Pneumonia 2012  . Pneumothorax, left   . Tobacco abuse     Patient Active Problem List   Diagnosis Date Noted  . Tobacco abuse 03/04/2011  . Pneumothorax, left 03/04/2011  . PULMONARY NODULE 02/23/2011    Past Surgical History:  Procedure Laterality Date  . CHEST TUBE INSERTION  2004   PTX - chest tube        Home Medications    Prior to Admission medications   Medication Sig Start Date End Date Taking? Authorizing Provider  acetaminophen (TYLENOL) 500 MG  tablet Take 1,000 mg by mouth every 6 (six) hours as needed for mild pain.   Yes [provider]  atorvastatin (LIPITOR) 10 MG tablet Take 10 mg by mouth daily. 04/22/18  Yes [provider]  traMADol-acetaminophen (ULTRACET) 37.5-325 MG tablet Take 1 tablet by mouth every 6 (six) hours as needed. 06/15/18  Yes Wallis BambergMani, Mario, PA-C  dicyclomine (BENTYL) 20 MG tablet Take 1 tablet (20 mg total) by mouth 2 (two) times daily. 06/17/18   Landis Cassaro, PA-C  famotidine (PEPCID) 20 MG tablet Take 1 tablet (20 mg total) by mouth 2 (two) times daily. 06/17/18   Dietrich PatesKhatri, Hanya Guerin, PA-C    Family History Family History  Problem Relation Age of Onset  . Cancer Father 56       throat  . Hypertension Mother        Pul HTN    Social History Social History   Tobacco Use  . Smoking status: Former Smoker    Packs/day: 1.00    Years: 20.00    Pack years: 20.00    Types: Cigarettes  . Smokeless tobacco: Never Used  Substance Use Topics  . Alcohol use: No  . Drug use: Not on file     Allergies   Sulfa antibiotics   Review of Systems Review of Systems  Constitutional: Negative for appetite change, chills and fever.  HENT: Negative for ear pain, rhinorrhea, sneezing and  sore throat.   Eyes: Negative for photophobia and visual disturbance.  Respiratory: Negative for cough, chest tightness, shortness of breath and wheezing.   Cardiovascular: Negative for chest pain and palpitations.  Gastrointestinal: Positive for abdominal pain, constipation and nausea. Negative for blood in stool, diarrhea and vomiting.  Genitourinary: Positive for flank pain. Negative for dysuria, hematuria, penile swelling, scrotal swelling and urgency.  Musculoskeletal: Negative for myalgias.  Skin: Negative for rash.  Neurological: Negative for dizziness, weakness and light-headedness.     Physical Exam Updated Vital Signs BP 117/70   Pulse 71   Temp 98.2 F (36.8 C) (Oral)   Resp 16   SpO2 94%   Physical  Exam  Constitutional: He appears well-developed and well-nourished. No distress.  HENT:  Head: Normocephalic and atraumatic.  Nose: Nose normal.  Eyes: Conjunctivae and EOM are normal. Right eye exhibits no discharge. Left eye exhibits no discharge. No scleral icterus.  Neck: Normal range of motion. Neck supple.  Cardiovascular: Normal rate, regular rhythm, normal heart sounds and intact distal pulses. Exam reveals no gallop and no friction rub.  No murmur heard. Pulmonary/Chest: Effort normal and breath sounds normal. No respiratory distress. He exhibits no tenderness.  Abdominal: Soft. Bowel sounds are normal. He exhibits no distension. There is tenderness (Right upper quadrant, right flank, right CVA). There is no guarding.  Musculoskeletal: Normal range of motion. He exhibits no edema.  Neurological: He is alert. He exhibits normal muscle tone. Coordination normal.  Skin: Skin is warm and dry. No rash noted.  Psychiatric: He has a normal mood and affect.  Nursing note and vitals reviewed.    ED Treatments / Results  Labs (all labs ordered are listed, but only abnormal results are displayed) Labs Reviewed  COMPREHENSIVE METABOLIC PANEL - Abnormal; Notable for the following components:      Result Value   Glucose, Bld 100 (*)    All other components within normal limits  URINALYSIS, ROUTINE W REFLEX MICROSCOPIC - Abnormal; Notable for the following components:   Hgb urine dipstick SMALL (*)    Leukocytes, UA TRACE (*)    All other components within normal limits  LIPASE, BLOOD  CBC    EKG None  Radiology US Abdomen Limited Ruq  Result Date: 06/17/2018 CLINICAL DATA:  RIGHT upper quadrant pain. EXAM: ULTRASOUND ABDOMEN LIMITED RIGHT UPPER QUADRANT COMPARISON:  None. FINDINGS: Gallbladder: No gallstones or wall thickening visualized. No sonographic Murphy sign noted by sonographer. Incidental note made of a small gallbladder wall polyp along the nondependent wall, measuring  3.5 mm Common bile duct: Diameter: 3 mm Liver: No focal lesion identified. Within normal limits in parenchymal echogenicity. Portal vein is patent on color Doppler imaging with normal direction of blood flow towards the liver. IMPRESSION: Essentially normal RIGHT upper quadrant ultrasound. No evidence of cholecystitis. No bile duct dilatation. Electronically Signed   By: Bary Richard M.D.   On: 06/17/2018 14:25    Procedures Procedures (including critical care time)  Medications Ordered in ED Medications  ketorolac (TORADOL) 30 MG/ML injection 30 mg (has no administration in time range)  sodium chloride 0.9 % bolus 1,000 mL (0 mLs Intravenous Stopped 06/17/18 1635)  morphine 4 MG/ML injection 4 mg (4 mg Intravenous Given 06/17/18 1345)     Initial Impression / Assessment and Plan / ED Course  I have reviewed the triage vital signs and the nursing notes.  Pertinent labs & imaging results that were available during my care of the patient were reviewed by  me and considered in my medical decision making (see chart for details).     47 year old male with a past medical history of tobacco abuse, alcohol use and marijuana use presents for 4-day history of right upper quadrant abdominal pain, right flank pain and right back pain.  No improvement with tramadol given for urgent care for concern for kidney stones after finding hematuria.  Primary care provider thought there was gallbladder pathology and scheduled him for an ultrasound in 4 days.  He is here because he has had ongoing pain.  He reports associated constipation and straining with bowel movements.  On physical exam he is overall well-appearing.  He has right upper quadrant, right flank and right back tenderness to palpation with no rebound or guarding noted.  He denies any chest pain or shortness of breath.  Lab work including CBC, CMP, urinalysis, lipase all unremarkable.  Ultrasound of the right upper quadrant was done which showed no  gallbladder etiology.  Suspect that his symptoms could be musculoskeletal in nature.  I doubt appendicitis, cardiac or pulmonary cause of his symptoms.  Will give MiraLAX for constipation, Pepcid for discomfort and follow-up with PCP.  Advised to return to ED for any severe worsening symptoms.  Portions of this note were generated with Scientist, clinical (histocompatibility and immunogenetics). Dictation errors may occur despite best attempts at proofreading.   Final Clinical Impressions(s) / ED Diagnoses   Final diagnoses:  RUQ pain  Musculoskeletal back pain    ED Discharge Orders        Ordered    famotidine (PEPCID) 20 MG tablet  2 times daily     06/17/18 1651    dicyclomine (BENTYL) 20 MG tablet  2 times daily     06/17/18 1651       Dietrich Pates, PA-C 06/17/18 1654    Margarita Grizzle, MD 06/18/18 [phone]

## 2018-06-17 NOTE — Discharge Instructions (Signed)
Return to ED for any worsening symptoms including severe abdominal pain, vomiting up blood, chest pain or shortness of breath.

## 2018-06-21 ENCOUNTER — Other Ambulatory Visit: Payer: BLUE CROSS/BLUE SHIELD

## 2018-06-22 DIAGNOSIS — R109 Unspecified abdominal pain: Secondary | ICD-10-CM | POA: Diagnosis not present

## 2018-06-22 DIAGNOSIS — F17210 Nicotine dependence, cigarettes, uncomplicated: Secondary | ICD-10-CM | POA: Diagnosis not present

## 2018-06-22 DIAGNOSIS — K5900 Constipation, unspecified: Secondary | ICD-10-CM | POA: Diagnosis not present

## 2018-08-18 DIAGNOSIS — Z23 Encounter for immunization: Secondary | ICD-10-CM | POA: Diagnosis not present

## 2018-08-18 DIAGNOSIS — Z131 Encounter for screening for diabetes mellitus: Secondary | ICD-10-CM | POA: Diagnosis not present

## 2018-08-18 DIAGNOSIS — Z125 Encounter for screening for malignant neoplasm of prostate: Secondary | ICD-10-CM | POA: Diagnosis not present

## 2018-08-18 DIAGNOSIS — Z1389 Encounter for screening for other disorder: Secondary | ICD-10-CM | POA: Diagnosis not present

## 2018-08-18 DIAGNOSIS — E7800 Pure hypercholesterolemia, unspecified: Secondary | ICD-10-CM | POA: Diagnosis not present

## 2018-08-18 DIAGNOSIS — Z0000 Encounter for general adult medical examination without abnormal findings: Secondary | ICD-10-CM | POA: Diagnosis not present

## 2018-09-14 DIAGNOSIS — R311 Benign essential microscopic hematuria: Secondary | ICD-10-CM | POA: Diagnosis not present

## 2018-10-02 DIAGNOSIS — R3129 Other microscopic hematuria: Secondary | ICD-10-CM | POA: Diagnosis not present

## 2018-10-02 DIAGNOSIS — R311 Benign essential microscopic hematuria: Secondary | ICD-10-CM | POA: Diagnosis not present

## 2018-10-06 DIAGNOSIS — R311 Benign essential microscopic hematuria: Secondary | ICD-10-CM | POA: Diagnosis not present

## 2019-02-13 ENCOUNTER — Other Ambulatory Visit: Payer: Self-pay

## 2019-02-13 ENCOUNTER — Ambulatory Visit (HOSPITAL_COMMUNITY)
Admission: EM | Admit: 2019-02-13 | Discharge: 2019-02-13 | Disposition: A | Discharge status: Home / Self Care | Payer: BLUE CROSS/BLUE SHIELD | Attending: Family Medicine | Admitting: Family Medicine

## 2019-02-13 ENCOUNTER — Encounter (HOSPITAL_COMMUNITY): Payer: Self-pay | Admitting: Emergency Medicine

## 2019-02-13 DIAGNOSIS — Z23 Encounter for immunization: Secondary | ICD-10-CM

## 2019-02-13 DIAGNOSIS — S61412A Laceration without foreign body of left hand, initial encounter: Secondary | ICD-10-CM

## 2019-02-13 DIAGNOSIS — W260XXA Contact with knife, initial encounter: Secondary | ICD-10-CM

## 2019-02-13 MED ORDER — TETANUS-DIPHTH-ACELL PERTUSSIS 5-2.5-18.5 LF-MCG/0.5 IM SUSP
INTRAMUSCULAR | Status: AC
  Filled 2019-02-13: qty 0.5

## 2019-02-13 MED ORDER — POVIDONE-IODINE 10 % EX SOLN
CUTANEOUS | Status: AC
  Filled 2019-02-13: qty 118

## 2019-02-13 MED ORDER — TETANUS-DIPHTH-ACELL PERTUSSIS 5-2.5-18.5 LF-MCG/0.5 IM SUSP
0.5000 mL | Freq: Once | INTRAMUSCULAR | Status: AC
Start: 2019-02-13 — End: 2019-02-13
  Administered 2019-02-13: 0.5 mL via INTRAMUSCULAR

## 2019-02-13 MED ORDER — LIDOCAINE HCL 2 % IJ SOLN
INTRAMUSCULAR | Status: AC
  Filled 2019-02-13: qty 20

## 2019-02-13 NOTE — ED Provider Notes (Signed)
[facility]    CSN: [id number] Arrival date & time: 02/13/19  1739     History   Chief Complaint Chief Complaint  Patient presents with  . Laceration    HPI Wesley Alvarez is a 48 y.o. male.   48 yo cut left hand while cutting onions earlier today in the webspace of the thumb     Past Medical History:  Diagnosis Date  . Bloating   . Constipation   . High cholesterol   . Keloid    h/o keloid, after boil removed form groin right. boil on stomach  . Pneumonia 2012  . Pneumothorax, left   . Tobacco abuse     Patient Active Problem List   Diagnosis Date Noted  . Tobacco abuse 03/04/2011  . Pneumothorax, left 03/04/2011  . PULMONARY NODULE 02/23/2011    Past Surgical History:  Procedure Laterality Date  . CHEST TUBE INSERTION  2004   PTX - chest tube       Home Medications    Prior to Admission medications   Medication Sig Start Date End Date Taking? Authorizing Provider  acetaminophen (TYLENOL) 500 MG tablet Take 1,000 mg by mouth every 6 (six) hours as needed for mild pain.    [provider]  atorvastatin (LIPITOR) 10 MG tablet Take 10 mg by mouth daily. 04/22/18   [provider]  famotidine (PEPCID) 20 MG tablet Take 1 tablet (20 mg total) by mouth 2 (two) times daily. 06/17/18   Khatri, Hina, PA-C  polyethylene glycol (MIRALAX / GLYCOLAX) packet Take 17 g by mouth daily. 06/17/18   Khatri, Hina, PA-C  traMADol-acetaminophen (ULTRACET) 37.5-325 MG tablet Take 1 tablet by mouth every 6 (six) hours as needed. 06/15/18   Wallis Bamberg, PA-C    Family History Family History  Problem Relation Age of Onset  . Cancer Father 56       throat  . Hypertension Mother        Pul HTN    Social History Social History   Tobacco Use  . Smoking status: Former Smoker    Packs/day: 1.00    Years: 20.00    Pack years: 20.00    Types: Cigarettes  . Smokeless tobacco: Never Used  Substance Use Topics  . Alcohol use: No  . Drug use: Not on  file     Allergies   Sulfa antibiotics   Review of Systems Review of Systems   Physical Exam Triage Vital Signs ED Triage Vitals  Enc Vitals Group     BP 02/13/19 1848 (!) 142/90     Pulse Rate 02/13/19 1848 77     Resp --      Temp 02/13/19 1848 98.6 F (37 C)     Temp Source 02/13/19 1848 Oral     SpO2 02/13/19 1848 100 %     Weight --      Height --      Head Circumference --      Peak Flow --      Pain Score 02/13/19 1847 0     Pain Loc --      Pain Edu? --      Excl. in GC? --    No data found.  Updated Vital Signs BP (!) 142/90 (BP Location: Left Arm)   Pulse 77   Temp 98.6 F (37 C) (Oral)   SpO2 100%    Physical Exam Vitals signs and nursing note reviewed.  Constitutional:  Appearance: Normal appearance.  HENT:     Head: Normocephalic.  Pulmonary:     Effort: Pulmonary effort is normal.  Musculoskeletal: Normal range of motion.  Skin:    General: Skin is warm and dry.     Comments: Open laceration left hand, thumb web space.  Neurological:     General: No focal deficit present.     Mental Status: He is alert.  Psychiatric:        Mood and Affect: Mood normal.      UC Treatments / Results  Labs (all labs ordered are listed, but only abnormal results are displayed) Labs Reviewed - No data to display  EKG None  Radiology No results found.  Procedures Laceration Repair Date/Time: 02/13/2019 7:14 PM Performed by: Elvina Sidle, MD Authorized by: Elvina Sidle, MD   Consent:    Consent obtained:  Verbal   Consent given by:  Patient   Risks discussed:  Pain, infection and need for additional repair   Alternatives discussed:  No treatment Anesthesia (see MAR for exact dosages):    Anesthesia method:  Local infiltration   Local anesthetic:  Lidocaine 2% w/o epi Laceration details:    Location:  Hand   Hand location:  L palm   Length (cm):  3   Depth (mm):  7 Repair type:    Repair type:  Intermediate Pre-procedure  details:    Preparation:  Patient was prepped and draped in usual sterile fashion Exploration:    Hemostasis achieved with:  Direct pressure   Wound exploration: wound explored through full range of motion     Contaminated: no   Treatment:    Area cleansed with:  Hibiclens and soap and water   Amount of cleaning:  Standard   Visualized foreign bodies/material removed: no   Skin repair:    Repair method:  Sutures   Suture size:  4-0   Suture material:  Nylon   Suture technique:  Horizontal mattress   Number of sutures:  4 Approximation:    Approximation:  Close Post-procedure details:    Dressing:  Bulky dressing   Patient tolerance of procedure:  Tolerated well, no immediate complications   (including critical care time)  Medications Ordered in UC Medications  Tdap (BOOSTRIX) injection 0.5 mL (0.5 mLs Intramuscular Given 02/13/19 1857)    Initial Impression / Assessment and Plan / UC Course  I have reviewed the triage vital signs and the nursing notes.  Pertinent labs & imaging results that were available during my care of the patient were reviewed by me and considered in my medical decision making (see chart for details).    Final Clinical Impressions(s) / UC Diagnoses   Final diagnoses:  Laceration of left hand without foreign body, initial encounter     Discharge Instructions     Return in 10 days, sooner for redness, swelling or increasing pain  Keep dry for 24 hours.  Use gloves at work    ED Prescriptions    None     Controlled Substance Prescriptions [place] Controlled Substance Registry consulted? Not Applicable   Elvina Sidle, MD 02/13/19 [phone]

## 2019-02-13 NOTE — ED Triage Notes (Signed)
Pt cut his left hand between his thumb and finger today while trying to cut an onion in his hand.

## 2019-02-13 NOTE — Discharge Instructions (Addendum)
Return in 10 days, sooner for redness, swelling or increasing pain  Keep dry for 24 hours.  Use gloves at work

## 2019-02-24 ENCOUNTER — Ambulatory Visit (HOSPITAL_COMMUNITY)
Admission: EM | Admit: 2019-02-24 | Discharge: 2019-02-24 | Disposition: A | Discharge status: Home / Self Care | Payer: BLUE CROSS/BLUE SHIELD

## 2019-02-24 NOTE — ED Triage Notes (Signed)
Pt here to have his sutures removed from his left hand (thumb ).

## 2019-07-19 DIAGNOSIS — B349 Viral infection, unspecified: Secondary | ICD-10-CM | POA: Diagnosis not present

## 2019-08-07 DIAGNOSIS — M545 Low back pain: Secondary | ICD-10-CM | POA: Diagnosis not present

## 2019-08-24 DIAGNOSIS — Z0000 Encounter for general adult medical examination without abnormal findings: Secondary | ICD-10-CM | POA: Diagnosis not present

## 2019-08-24 DIAGNOSIS — Z23 Encounter for immunization: Secondary | ICD-10-CM | POA: Diagnosis not present

## 2019-08-24 DIAGNOSIS — Z125 Encounter for screening for malignant neoplasm of prostate: Secondary | ICD-10-CM | POA: Diagnosis not present

## 2019-08-24 DIAGNOSIS — E7800 Pure hypercholesterolemia, unspecified: Secondary | ICD-10-CM | POA: Diagnosis not present

## 2019-08-24 DIAGNOSIS — R7303 Prediabetes: Secondary | ICD-10-CM | POA: Diagnosis not present

## 2019-08-24 DIAGNOSIS — Z8249 Family history of ischemic heart disease and other diseases of the circulatory system: Secondary | ICD-10-CM | POA: Diagnosis not present

## 2019-09-11 DIAGNOSIS — N5201 Erectile dysfunction due to arterial insufficiency: Secondary | ICD-10-CM | POA: Diagnosis not present

## 2019-10-27 IMAGING — US US ABDOMEN LIMITED
1 series · 14 of 25 positions shown · non-contrast
Comparison: None.

CLINICAL DATA: RIGHT upper quadrant pain.

EXAM:
ULTRASOUND ABDOMEN LIMITED RIGHT UPPER QUADRANT

[Series 1: us abdomen limited · 0.20mm/px · 56 acquisitions, 14 frames shown]
[im 1/56]
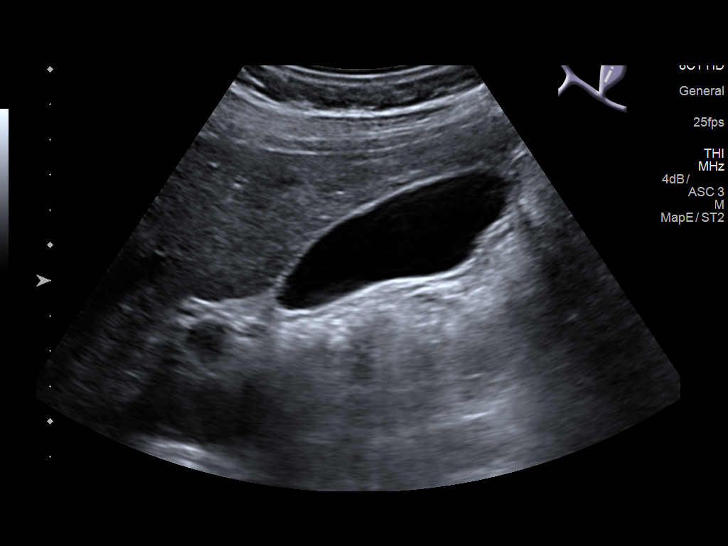
[im 5/56]
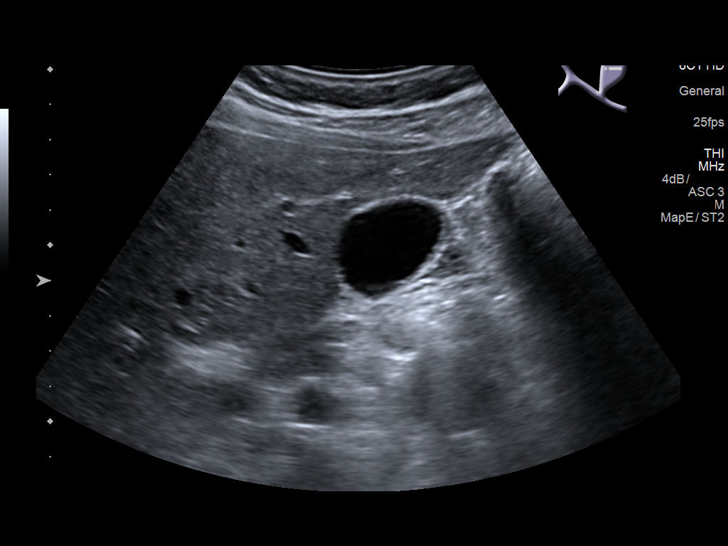
[im 10/56]
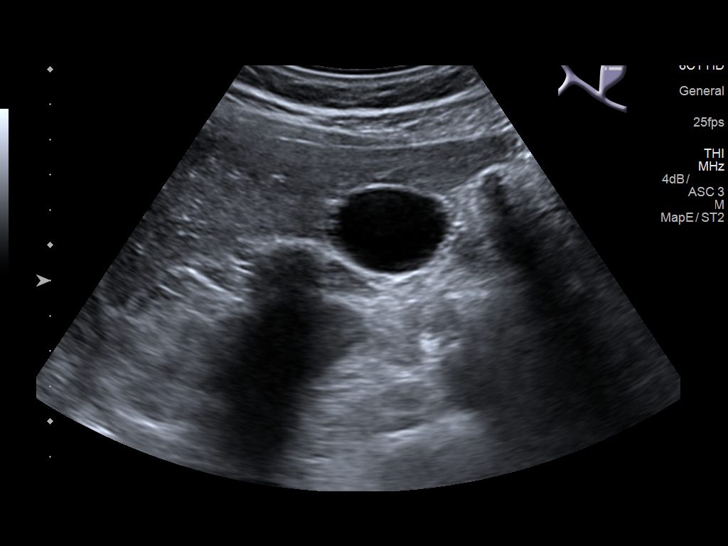
[im 14/56]
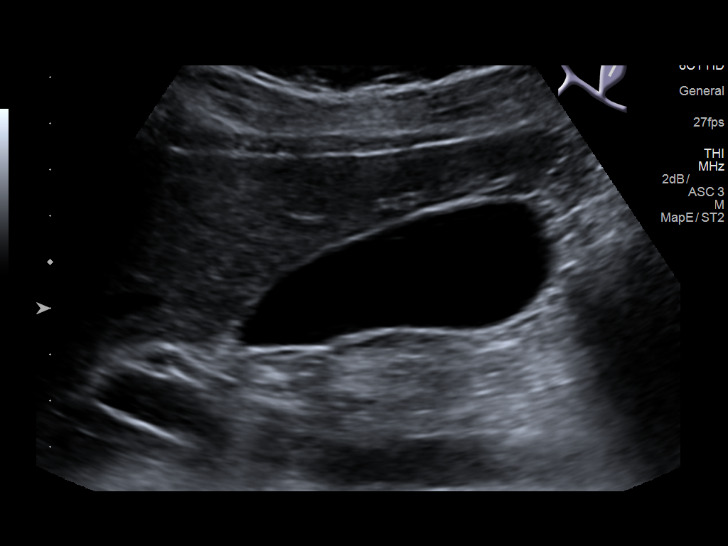
[im 19/56]
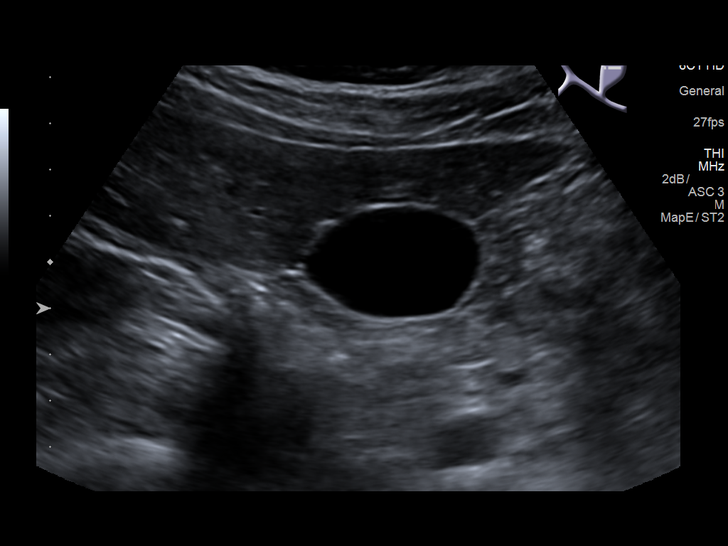
[im 21/56]
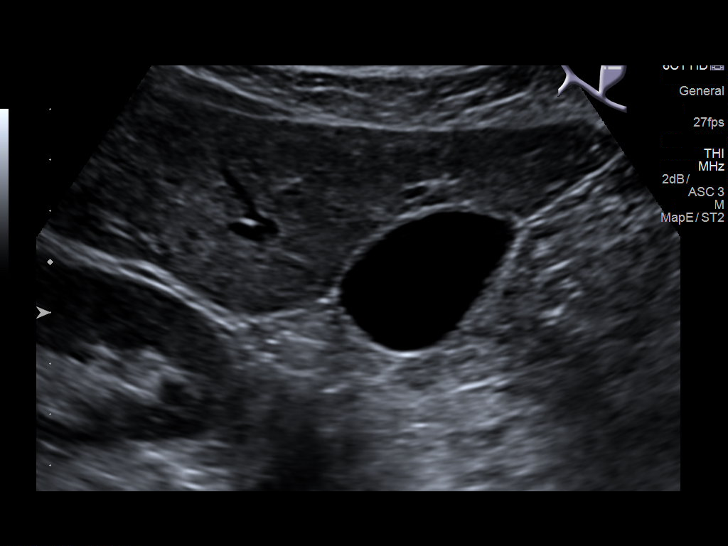
[im 26/56]
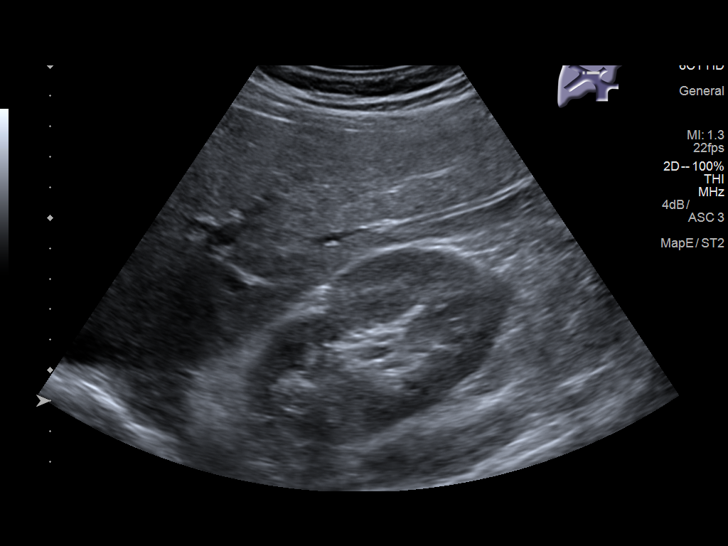
[im 30/56]
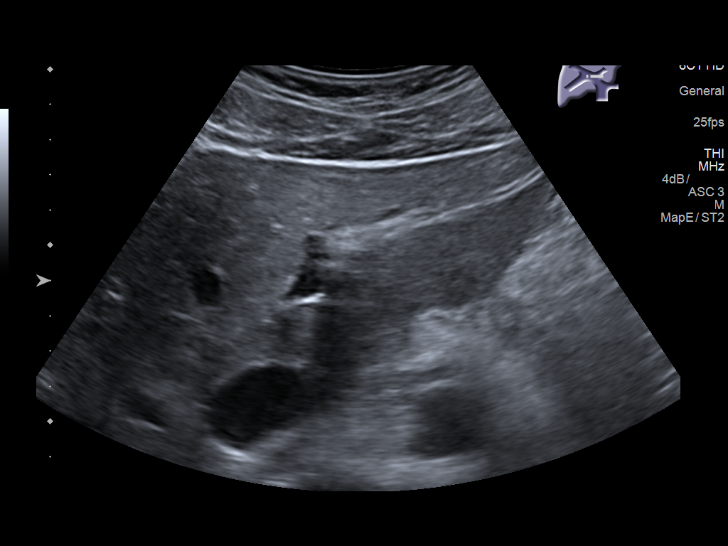
[im 35/56]
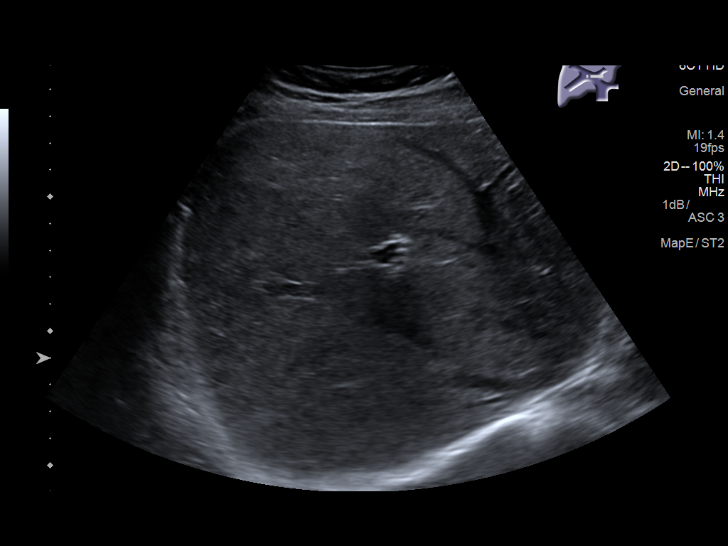
[im 37/56]
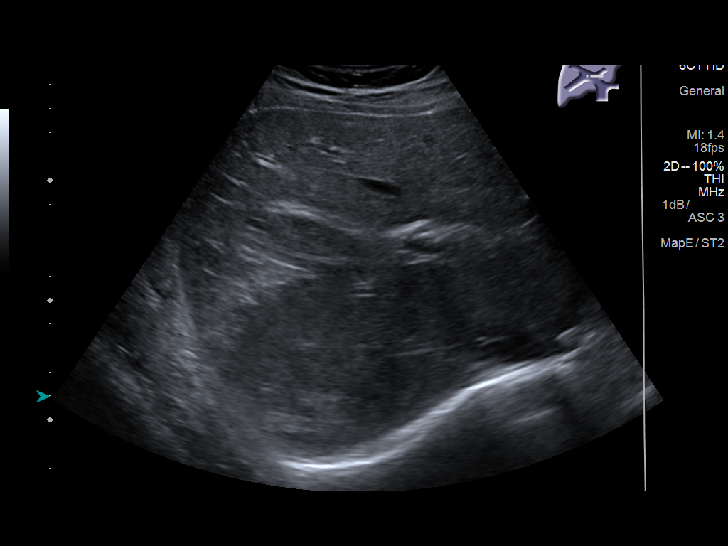
[im 42/56]
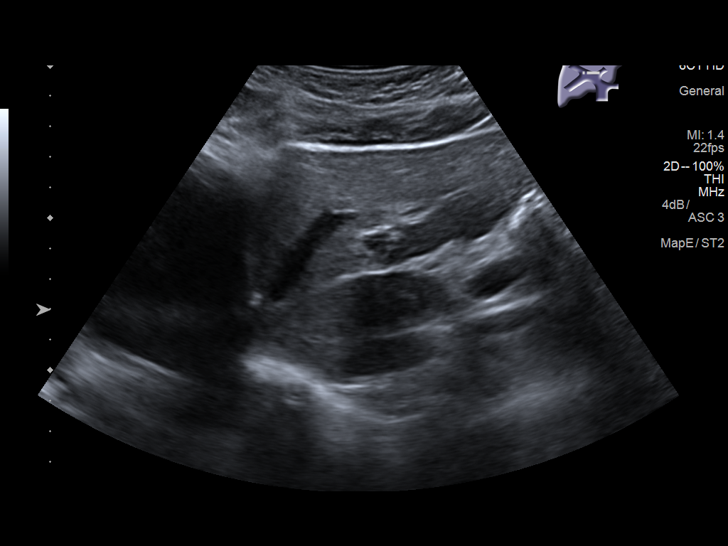
[im 46/56]
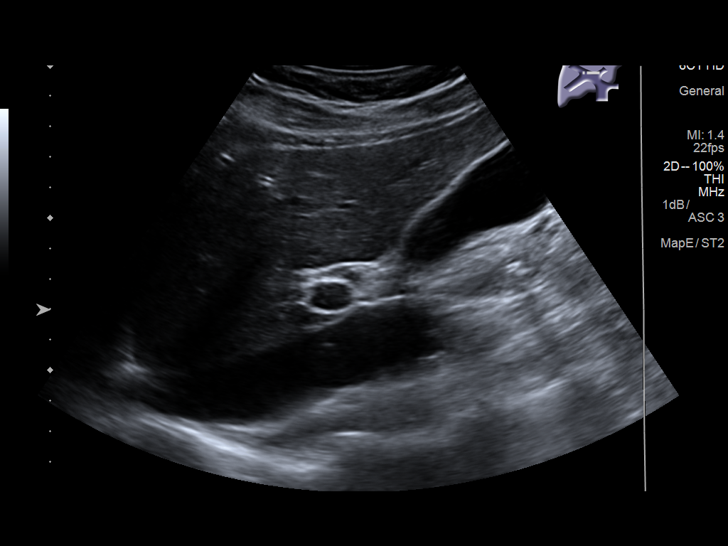
[im 51/56]
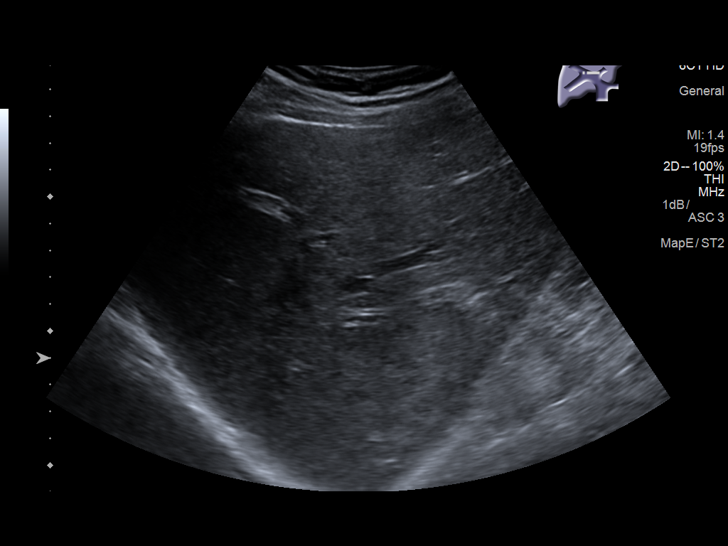
[im 56/56]
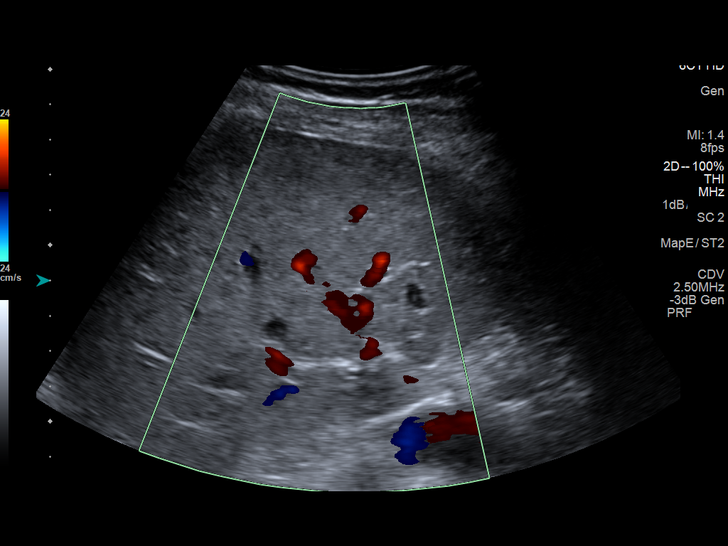

[14 of 25 positions shown; findings below may reference images not displayed]

FINDINGS: Gallbladder:

No gallstones or wall thickening visualized. No sonographic Murphy
sign noted by sonographer. Incidental note made of a small
gallbladder wall polyp along the nondependent wall, measuring 3.5 mm

Common bile duct:

Diameter: 3 mm

Liver:

No focal lesion identified. Within normal limits in parenchymal
echogenicity. Portal vein is patent on color Doppler imaging with
normal direction of blood flow towards the liver.
IMPRESSION: Essentially normal RIGHT upper quadrant ultrasound. No evidence of
cholecystitis. No bile duct dilatation.

## 2019-10-29 DIAGNOSIS — E7800 Pure hypercholesterolemia, unspecified: Secondary | ICD-10-CM | POA: Diagnosis not present

## 2020-01-25 ENCOUNTER — Ambulatory Visit: Payer: BC Managed Care – PPO | Attending: Internal Medicine

## 2020-01-25 DIAGNOSIS — Z20828 Contact with and (suspected) exposure to other viral communicable diseases: Secondary | ICD-10-CM | POA: Diagnosis not present

## 2020-01-25 DIAGNOSIS — Z20822 Contact with and (suspected) exposure to covid-19: Secondary | ICD-10-CM | POA: Diagnosis not present

## 2020-01-25 DIAGNOSIS — Z03818 Encounter for observation for suspected exposure to other biological agents ruled out: Secondary | ICD-10-CM | POA: Diagnosis not present

## 2020-01-26 LAB — NOVEL CORONAVIRUS, NAA: SARS-CoV-2, NAA: NOT DETECTED

## 2020-08-27 DIAGNOSIS — Z125 Encounter for screening for malignant neoplasm of prostate: Secondary | ICD-10-CM | POA: Diagnosis not present

## 2020-08-27 DIAGNOSIS — Z1389 Encounter for screening for other disorder: Secondary | ICD-10-CM | POA: Diagnosis not present

## 2020-08-27 DIAGNOSIS — E7800 Pure hypercholesterolemia, unspecified: Secondary | ICD-10-CM | POA: Diagnosis not present

## 2020-08-27 DIAGNOSIS — Z23 Encounter for immunization: Secondary | ICD-10-CM | POA: Diagnosis not present

## 2020-08-27 DIAGNOSIS — R7303 Prediabetes: Secondary | ICD-10-CM | POA: Diagnosis not present

## 2020-08-27 DIAGNOSIS — Z0000 Encounter for general adult medical examination without abnormal findings: Secondary | ICD-10-CM | POA: Diagnosis not present

## 2020-12-23 DIAGNOSIS — Z1152 Encounter for screening for COVID-19: Secondary | ICD-10-CM | POA: Diagnosis not present

## 2021-02-25 DIAGNOSIS — N529 Male erectile dysfunction, unspecified: Secondary | ICD-10-CM | POA: Diagnosis not present

## 2021-02-25 DIAGNOSIS — E7800 Pure hypercholesterolemia, unspecified: Secondary | ICD-10-CM | POA: Diagnosis not present

## 2021-02-25 DIAGNOSIS — F17210 Nicotine dependence, cigarettes, uncomplicated: Secondary | ICD-10-CM | POA: Diagnosis not present

## 2021-02-25 DIAGNOSIS — R7303 Prediabetes: Secondary | ICD-10-CM | POA: Diagnosis not present

## 2021-04-27 DIAGNOSIS — E7800 Pure hypercholesterolemia, unspecified: Secondary | ICD-10-CM | POA: Diagnosis not present

## 2021-05-12 DIAGNOSIS — H11133 Conjunctival pigmentations, bilateral: Secondary | ICD-10-CM | POA: Diagnosis not present

## 2021-05-12 DIAGNOSIS — H2513 Age-related nuclear cataract, bilateral: Secondary | ICD-10-CM | POA: Diagnosis not present

## 2021-06-25 ENCOUNTER — Ambulatory Visit (INDEPENDENT_AMBULATORY_CARE_PROVIDER_SITE_OTHER): Payer: BC Managed Care – PPO

## 2021-06-25 ENCOUNTER — Other Ambulatory Visit: Payer: Self-pay

## 2021-06-25 ENCOUNTER — Encounter (HOSPITAL_COMMUNITY): Payer: Self-pay | Admitting: Emergency Medicine

## 2021-06-25 ENCOUNTER — Ambulatory Visit
Admission: EM | Admit: 2021-06-25 | Discharge: 2021-06-25 | Disposition: A | Discharge status: Home / Self Care | Payer: BC Managed Care – PPO

## 2021-06-25 ENCOUNTER — Ambulatory Visit (HOSPITAL_COMMUNITY)
Admission: EM | Admit: 2021-06-25 | Discharge: 2021-06-25 | Disposition: A | Discharge status: Home / Self Care | Payer: BC Managed Care – PPO | Attending: Emergency Medicine | Admitting: Emergency Medicine

## 2021-06-25 DIAGNOSIS — J439 Emphysema, unspecified: Secondary | ICD-10-CM | POA: Diagnosis not present

## 2021-06-25 DIAGNOSIS — R071 Chest pain on breathing: Secondary | ICD-10-CM | POA: Diagnosis not present

## 2021-06-25 DIAGNOSIS — M545 Low back pain, unspecified: Secondary | ICD-10-CM

## 2021-06-25 MED ORDER — NAPROXEN 500 MG PO TABS: 500.0000 mg | ORAL_TABLET | Freq: Two times a day (BID) | ORAL | 0 refills | Status: AC

## 2021-06-25 MED ORDER — CYCLOBENZAPRINE HCL 10 MG PO TABS: 10.0000 mg | ORAL_TABLET | Freq: Every day | ORAL | 0 refills | Status: AC

## 2021-06-25 NOTE — ED Triage Notes (Signed)
Patient c/o mid lower back pain that started last night.   Patient denies fall or trauma.   Patient endorses increased pain with inhalation.   Patient reports history of similar symptoms when " I had pneumonia and had the same lower back pain when that happened".   Patient has taken Tylenol with no relief of symptoms.

## 2021-06-25 NOTE — Discharge Instructions (Signed)
Your chest x-ray did not show infection or fluid but did shows signs of emphysema. This is mostly likely caused by long term smoking. There is no immediate need for evaluation however if you begin to have increased shortness of breath, chest tightness, increased coughing, wheezing follow up with primary doctor  Use naproxen twice a day for 5 days then as needed  Can use muscle relaxer as needed for bedtime, be mindful this may make you drowsy  Gentle stretching as tolerated  Heating pad 15 minutes for additional comfort

## 2021-06-25 NOTE — ED Provider Notes (Signed)
[facility]    CSN: [id number] Arrival date & time: 06/25/21  0931      History   Chief Complaint Chief Complaint  Patient presents with   Back Pain    HPI Wesley Alvarez is a 50 y.o. male.   Patient presents with mid and bilateral lower back pain that started last night.  Pain can be felt with deep breathing.  Denies radiating pain, numbness, tingling, injury or trauma, chest pain or tightness, shortness of breath, wheezing.  Range of motion intact.  Worsen with long periods of sitting or standing.  Has attempted use of Tylenol with no relief.  History of pneumonia and pneumothorax.  Current every day smoker.  Past Medical History:  Diagnosis Date   Bloating    Constipation    High cholesterol    Keloid    h/o keloid, after boil removed form groin right. boil on stomach   Pneumonia 2012   Pneumothorax, left    Tobacco abuse     Patient Active Problem List   Diagnosis Date Noted   Tobacco abuse 03/04/2011   Pneumothorax, left 03/04/2011   PULMONARY NODULE 02/23/2011    Past Surgical History:  Procedure Laterality Date   CHEST TUBE INSERTION  2004   PTX - chest tube       Home Medications    Prior to Admission medications   Medication Sig Start Date End Date Taking? Authorizing Provider  acetaminophen (TYLENOL) 500 MG tablet Take 1,000 mg by mouth every 6 (six) hours as needed for mild pain.   Yes [provider]  atorvastatin (LIPITOR) 10 MG tablet Take 10 mg by mouth daily. 04/22/18  Yes [provider]  cyclobenzaprine (FLEXERIL) 10 MG tablet Take 1 tablet (10 mg total) by mouth at bedtime. 06/25/21  Yes Johnmichael Melhorn R, NP  naproxen (NAPROSYN) 500 MG tablet Take 1 tablet (500 mg total) by mouth 2 (two) times daily. 06/25/21  Yes Zylah Elsbernd R, NP  famotidine (PEPCID) 20 MG tablet Take 1 tablet (20 mg total) by mouth 2 (two) times daily. 06/17/18   Khatri, Hina, PA-C  polyethylene glycol (MIRALAX / GLYCOLAX) packet Take 17 g by  mouth daily. 06/17/18   Khatri, Hina, PA-C  traMADol-acetaminophen (ULTRACET) 37.5-325 MG tablet Take 1 tablet by mouth every 6 (six) hours as needed. 06/15/18   Wallis Bamberg, PA-C    Family History Family History  Problem Relation Age of Onset   Cancer Father 68       throat   Hypertension Mother        Pul HTN    Social History Social History   Tobacco Use   Smoking status: Former    Packs/day: 1.00    Years: 20.00    Pack years: 20.00    Types: Cigarettes   Smokeless tobacco: Never  Substance Use Topics   Alcohol use: No     Allergies   Sulfa antibiotics   Review of Systems Review of Systems Defer to HPI   Physical Exam Triage Vital Signs ED Triage Vitals  Enc Vitals Group     BP 06/25/21 1013 131/79     Pulse Rate 06/25/21 1013 73     Resp 06/25/21 1013 15     Temp 06/25/21 1013 98.4 F (36.9 C)     Temp Source 06/25/21 1013 Oral     SpO2 06/25/21 1013 96 %     Weight --      Height --  Head Circumference --      Peak Flow --      Pain Score 06/25/21 1012 9     Pain Loc --      Pain Edu? --      Excl. in GC? --    No data found.  Updated Vital Signs BP 131/79 (BP Location: Right Arm)   Pulse 73   Temp 98.4 F (36.9 C) (Oral)   Resp 15   SpO2 96%   Visual Acuity Right Eye Distance:   Left Eye Distance:   Bilateral Distance:    Right Eye Near:   Left Eye Near:    Bilateral Near:     Physical Exam Constitutional:      Appearance: Normal appearance. He is normal weight.  HENT:     Head: Normocephalic.  Cardiovascular:     Rate and Rhythm: Normal rate and regular rhythm.     Pulses: Normal pulses.     Heart sounds: Normal heart sounds.  Pulmonary:     Effort: Pulmonary effort is normal.     Breath sounds: Normal breath sounds.  Musculoskeletal:     Cervical back: Normal.     Thoracic back: Normal.     Lumbar back: Tenderness present. No swelling, spasms or bony tenderness. Normal range of motion.       Back:  Skin:     General: Skin is warm and dry.  Neurological:     Mental Status: He is alert and oriented to person, place, and time. Mental status is at baseline.  Psychiatric:        Mood and Affect: Mood normal.        Behavior: Behavior normal.     UC Treatments / Results  Labs (all labs ordered are listed, but only abnormal results are displayed) Labs Reviewed - No data to display  EKG   Radiology DG Chest 2 View  Result Date: 06/25/2021 CLINICAL DATA:  Pain with breathing EXAM: CHEST - 2 VIEW COMPARISON:  06/15/2018 FINDINGS: Bullous emphysematous changes in the lungs most notable in the right upper lobe. Heart is normal size. No confluent opacities or effusions. No acute bony abnormality. 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 Electronically Signed   By: Charlett Nose M.D.   On: 06/25/2021 10:40    Procedures Procedures (including critical care time)  Medications Ordered in UC Medications - No data to display  Initial Impression / Assessment and Plan / UC Course  I have reviewed the triage vital signs and the nursing notes.  Pertinent labs & imaging results that were available during my care of the patient were reviewed by me and considered in my medical decision making (see chart for details).  Acute bilateral low back pain without sciatica  1.  Chest x-ray negative for significant infection however there is emphysema, discussed this with patient, no immediate need for evaluation, respiratory symptoms not involved, precautions given to follow-up with primary care doctor, smoking cessation discussed 2.  Naproxen 500 mg twice daily for 5 days then as needed 3.  Flexeril 10 mg at bedtime as needed 4.  Gentle stretching as tolerated 5.  Heating pad in 15-minute intervals as tolerated Final Clinical Impressions(s) / UC Diagnoses   Final diagnoses:  Acute bilateral low back pain without sciatica     Discharge Instructions      Your chest x-ray did not show infection or  fluid but did shows signs of emphysema. This is mostly likely caused by long term smoking.  There is no immediate need for evaluation however if you begin to have increased shortness of breath, chest tightness, increased coughing, wheezing follow up with primary doctor  Use naproxen twice a day for 5 days then as needed  Can use muscle relaxer as needed for bedtime, be mindful this may make you drowsy  Gentle stretching as tolerated  Heating pad 15 minutes for additional comfort    ED Prescriptions     Medication Sig Dispense Auth. Provider   naproxen (NAPROSYN) 500 MG tablet Take 1 tablet (500 mg total) by mouth 2 (two) times daily. 30 tablet Kaytlynn Kochan R, NP   cyclobenzaprine (FLEXERIL) 10 MG tablet Take 1 tablet (10 mg total) by mouth at bedtime. 10 tablet Valinda Hoar, NP      PDMP not reviewed this encounter.   Valinda Hoar, NP 06/25/21 1117

## 2021-11-30 ENCOUNTER — Ambulatory Visit
Admission: EM | Admit: 2021-11-30 | Discharge: 2021-11-30 | Disposition: A | Discharge status: Home / Self Care | Payer: BC Managed Care – PPO | Attending: Physician Assistant | Admitting: Physician Assistant

## 2021-11-30 ENCOUNTER — Other Ambulatory Visit: Payer: Self-pay

## 2021-11-30 ENCOUNTER — Ambulatory Visit (INDEPENDENT_AMBULATORY_CARE_PROVIDER_SITE_OTHER): Payer: BC Managed Care – PPO

## 2021-11-30 DIAGNOSIS — M940 Chondrocostal junction syndrome [Tietze]: Secondary | ICD-10-CM | POA: Diagnosis not present

## 2021-11-30 DIAGNOSIS — R079 Chest pain, unspecified: Secondary | ICD-10-CM

## 2021-11-30 DIAGNOSIS — R0602 Shortness of breath: Secondary | ICD-10-CM | POA: Diagnosis not present

## 2021-11-30 DIAGNOSIS — J439 Emphysema, unspecified: Secondary | ICD-10-CM | POA: Diagnosis not present

## 2021-11-30 MED ORDER — PREDNISONE 20 MG PO TABS: 40.0000 mg | ORAL_TABLET | Freq: Every day | ORAL | 0 refills | Status: AC

## 2021-11-30 NOTE — ED Provider Notes (Signed)
[facility]    CSN: [id number] Arrival date & time: 11/30/21  0825      History   Chief Complaint Chief Complaint  Patient presents with   Chest Pain    HPI Wesley Alvarez is a 50 y.o. male.   Patient here today for evaluation of chest tightness and discomfort when he coughs or takes deep breath.  He states that symptoms started a few days ago.  He has had pneumothorax in the past and states that current symptoms seem very similar to that he experienced in the past with pneumothorax.  He has not had any significant lightheadedness, or nausea or vomiting.  He denies that activity makes symptoms worse.  Pain does not radiate.    The history is provided by the patient.  Chest Pain Associated symptoms: cough   Associated symptoms: no fever, no nausea, no numbness, no shortness of breath and no vomiting    Past Medical History:  Diagnosis Date   Bloating    Constipation    High cholesterol    Keloid    h/o keloid, after boil removed form groin right. boil on stomach   Pneumonia 2012   Pneumothorax, left    Tobacco abuse     Patient Active Problem List   Diagnosis Date Noted   Tobacco abuse 03/04/2011   Pneumothorax, left 03/04/2011   PULMONARY NODULE 02/23/2011    Past Surgical History:  Procedure Laterality Date   CHEST TUBE INSERTION  2004   PTX - chest tube       Home Medications    Prior to Admission medications   Medication Sig Start Date End Date Taking? Authorizing Provider  predniSONE (DELTASONE) 20 MG tablet Take 2 tablets (40 mg total) by mouth daily with breakfast for 5 days. 11/30/21 12/05/21 Yes Tomi Bamberger, PA-C  acetaminophen (TYLENOL) 500 MG tablet Take 1,000 mg by mouth every 6 (six) hours as needed for mild pain.    [provider]  atorvastatin (LIPITOR) 10 MG tablet Take 10 mg by mouth daily. 04/22/18   [provider]  cyclobenzaprine (FLEXERIL) 10 MG tablet Take 1 tablet (10 mg total) by mouth at bedtime.  06/25/21   White, Elita Boone, NP  famotidine (PEPCID) 20 MG tablet Take 1 tablet (20 mg total) by mouth 2 (two) times daily. 06/17/18   Khatri, Hina, PA-C  naproxen (NAPROSYN) 500 MG tablet Take 1 tablet (500 mg total) by mouth 2 (two) times daily. 06/25/21   White, Elita Boone, NP  polyethylene glycol (MIRALAX / GLYCOLAX) packet Take 17 g by mouth daily. 06/17/18   Khatri, Hina, PA-C  traMADol-acetaminophen (ULTRACET) 37.5-325 MG tablet Take 1 tablet by mouth every 6 (six) hours as needed. 06/15/18   Wallis Bamberg, PA-C    Family History Family History  Problem Relation Age of Onset   Cancer Father 73       throat   Hypertension Mother        Pul HTN    Social History Social History   Tobacco Use   Smoking status: Former    Packs/day: 1.00    Years: 20.00    Pack years: 20.00    Types: Cigarettes   Smokeless tobacco: Never  Substance Use Topics   Alcohol use: No     Allergies   Sulfa antibiotics   Review of Systems Review of Systems  Constitutional:  Negative for chills and fever.  HENT:  Negative for congestion.   Eyes:  Negative for  discharge and redness.  Respiratory:  Positive for cough and chest tightness. Negative for shortness of breath.   Cardiovascular:  Positive for chest pain.  Gastrointestinal:  Negative for nausea and vomiting.  Skin:  Positive for color change and wound.  Neurological:  Negative for light-headedness and numbness.    Physical Exam Triage Vital Signs ED Triage Vitals  Enc Vitals Group     BP 11/30/21 0841 (!) 144/93     Pulse Rate 11/30/21 0841 79     Resp 11/30/21 0841 18     Temp 11/30/21 0841 98.2 F (36.8 C)     Temp Source 11/30/21 0841 Oral     SpO2 11/30/21 0841 98 %     Weight --      Height --      Head Circumference --      Peak Flow --      Pain Score 11/30/21 0842 6     Pain Loc --      Pain Edu? --      Excl. in GC? --    No data found.  Updated Vital Signs BP (!) 144/93 (BP Location: Left Arm)    Pulse 79    Temp  98.2 F (36.8 C) (Oral)    Resp 18    SpO2 98%      Physical Exam Vitals and nursing note reviewed.  Constitutional:      General: He is not in acute distress.    Appearance: Normal appearance. He is not ill-appearing.  HENT:     Head: Normocephalic and atraumatic.  Eyes:     Conjunctiva/sclera: Conjunctivae normal.  Cardiovascular:     Rate and Rhythm: Normal rate and regular rhythm.     Heart sounds: Normal heart sounds. No murmur heard. Pulmonary:     Effort: Pulmonary effort is normal. No respiratory distress.     Breath sounds: Normal breath sounds. No wheezing, rhonchi or rales.  Neurological:     Mental Status: He is alert.  Psychiatric:        Mood and Affect: Mood normal.        Behavior: Behavior normal.        Thought Content: Thought content normal.     UC Treatments / Results  Labs (all labs ordered are listed, but only abnormal results are displayed) Labs Reviewed - No data to display  EKG   Radiology DG Chest 2 View  Result Date: 11/30/2021 CLINICAL DATA:  Chest pain shortness of breath EXAM: CHEST - 2 VIEW COMPARISON:  Chest x-ray 06/25/2021 FINDINGS: Heart size is normal. Mediastinum appears stable. No focal consolidation identified. Lungs are hyperinflated with diffuse prominent chronic interstitial lung markings as well as bullous changes in the upper lungs, right greater than left. No pleural effusion or pneumothorax. IMPRESSION: Emphysematous changes with no focal consolidation identified. Electronically Signed   By: Jannifer Hick M.D.   On: 11/30/2021 09:03    Procedures Procedures (including critical care time)  Medications Ordered in UC Medications - No data to display  Initial Impression / Assessment and Plan / UC Course  I have reviewed the triage vital signs and the nursing notes.  Pertinent labs & imaging results that were available during my care of the patient were reviewed by me and considered in my medical decision making (see  chart for details).  Chest x-ray within normal limits.  EKG with normal sinus rhythm, similar when compared to EKG from 3 years ago.  Symptoms are not consistent  with cardiac etiology but did recommend emergency department eval if symptoms worsen in any way.  Will treat with steroids to cover possible costochondritis.  Recommend follow-up with any further concerns.  Final Clinical Impressions(s) / UC Diagnoses   Final diagnoses:  Costochondritis   Discharge Instructions   None    ED Prescriptions     Medication Sig Dispense Auth. Provider   predniSONE (DELTASONE) 20 MG tablet Take 2 tablets (40 mg total) by mouth daily with breakfast for 5 days. 10 tablet Tomi Bamberger, PA-C      PDMP not reviewed this encounter.   Tomi Bamberger, PA-C 11/30/21 [phone]

## 2021-11-30 NOTE — ED Triage Notes (Signed)
Pt c/o chest pain, cough, "every time I cough something is tightening up or if I take a deep breath."  Denies sore throat, nasal congestion, headache, ear ache, body aches or chills, nausea, vomiting, diarrhea, constipation.   Onset Saturday. States several years ago he had a pneumothorax and says "that's what it feels like."

## 2021-12-09 DIAGNOSIS — J3489 Other specified disorders of nose and nasal sinuses: Secondary | ICD-10-CM | POA: Diagnosis not present

## 2021-12-29 DIAGNOSIS — Z20822 Contact with and (suspected) exposure to covid-19: Secondary | ICD-10-CM | POA: Diagnosis not present

## 2021-12-29 DIAGNOSIS — U071 COVID-19: Secondary | ICD-10-CM | POA: Diagnosis not present

## 2022-04-15 DIAGNOSIS — Z125 Encounter for screening for malignant neoplasm of prostate: Secondary | ICD-10-CM | POA: Diagnosis not present

## 2022-04-15 DIAGNOSIS — Z5181 Encounter for therapeutic drug level monitoring: Secondary | ICD-10-CM | POA: Diagnosis not present

## 2022-04-15 DIAGNOSIS — Z0000 Encounter for general adult medical examination without abnormal findings: Secondary | ICD-10-CM | POA: Diagnosis not present

## 2022-04-15 DIAGNOSIS — E7800 Pure hypercholesterolemia, unspecified: Secondary | ICD-10-CM | POA: Diagnosis not present

## 2022-04-15 DIAGNOSIS — R7303 Prediabetes: Secondary | ICD-10-CM | POA: Diagnosis not present

## 2022-05-19 DIAGNOSIS — H11133 Conjunctival pigmentations, bilateral: Secondary | ICD-10-CM | POA: Diagnosis not present

## 2022-05-19 DIAGNOSIS — H2513 Age-related nuclear cataract, bilateral: Secondary | ICD-10-CM | POA: Diagnosis not present

## 2022-06-20 IMAGING — DX DG CHEST 2V
2 series · 2 of 2 positions shown · non-contrast
Comparison: Chest x-ray 06/25/2021

CLINICAL DATA: Chest pain shortness of breath

EXAM:
CHEST - 2 VIEW

[chest pa]
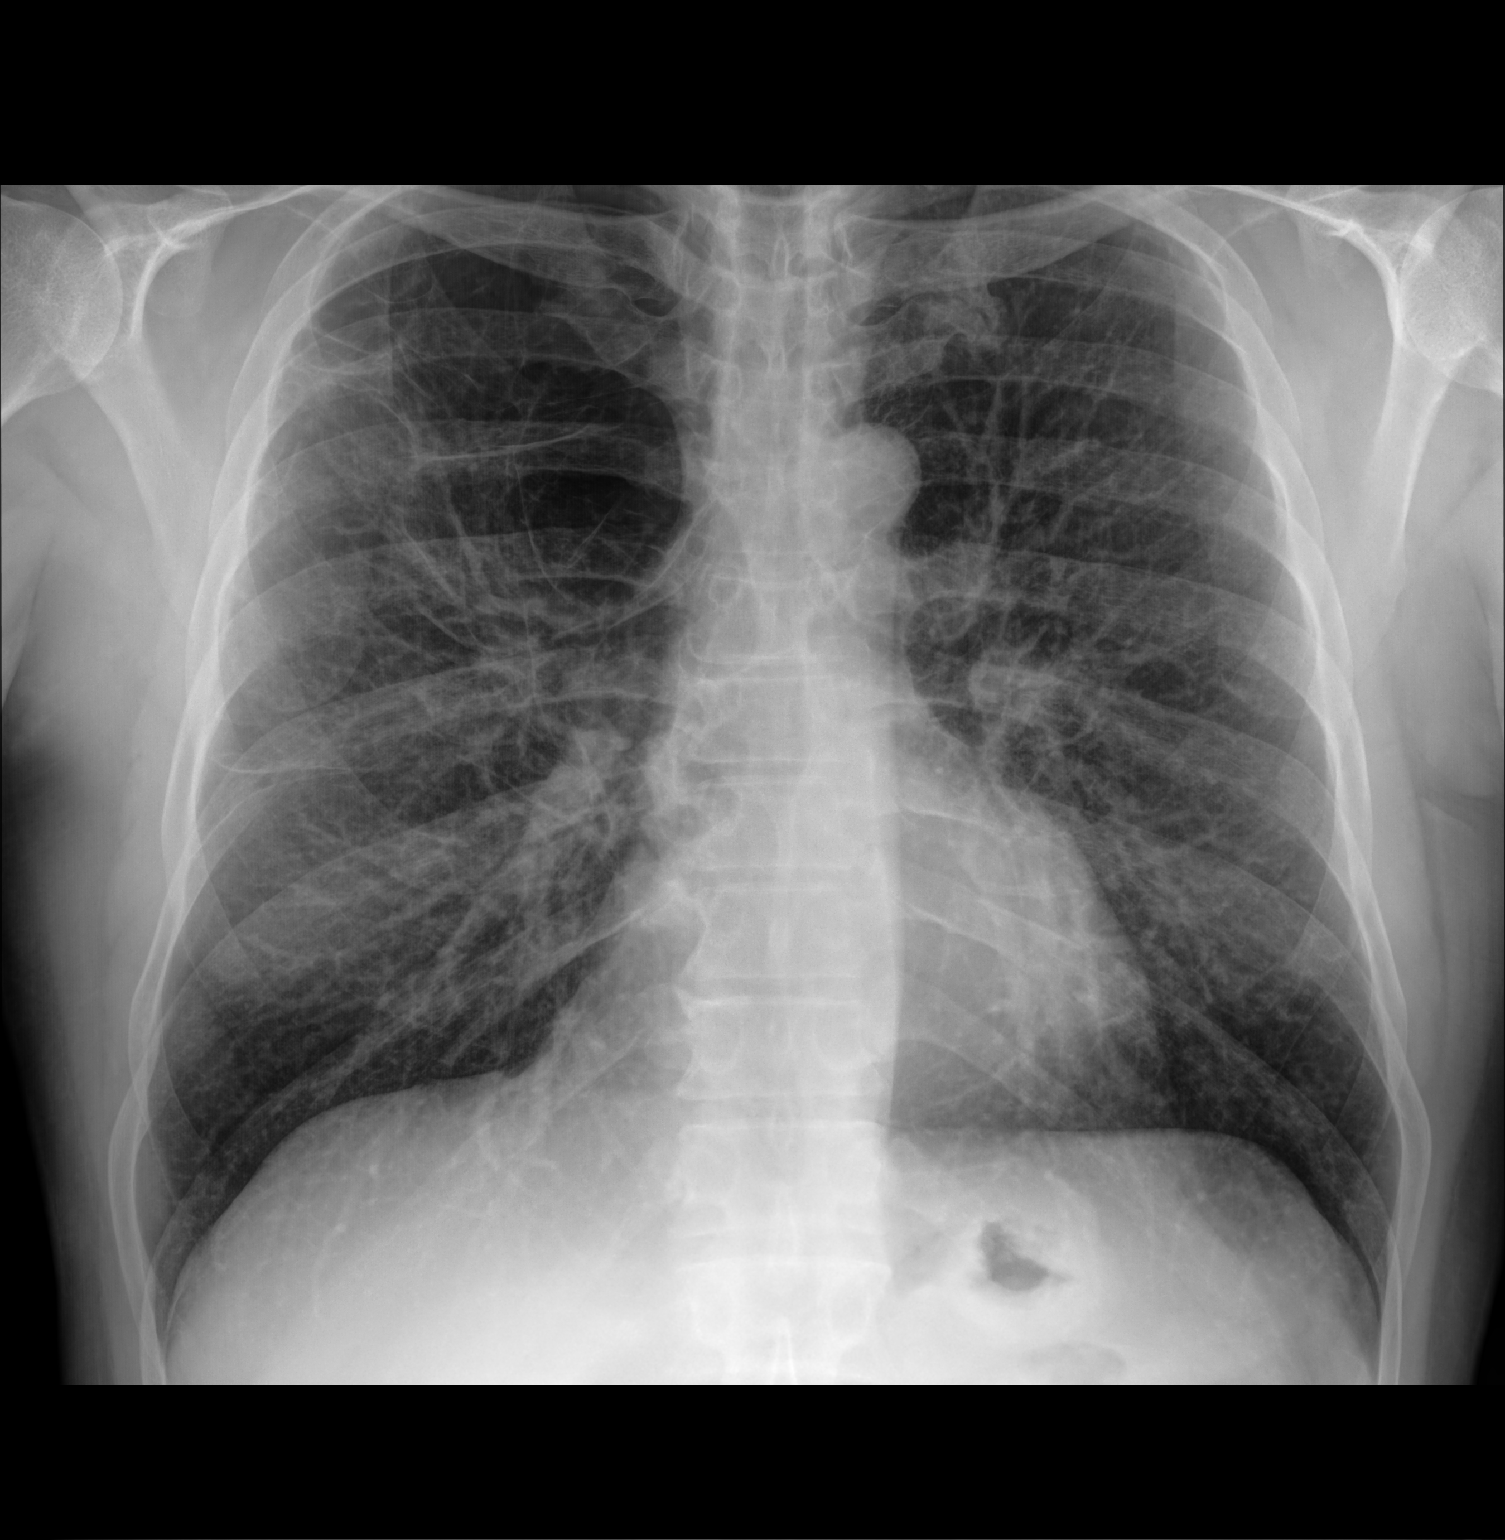

[chest lat]
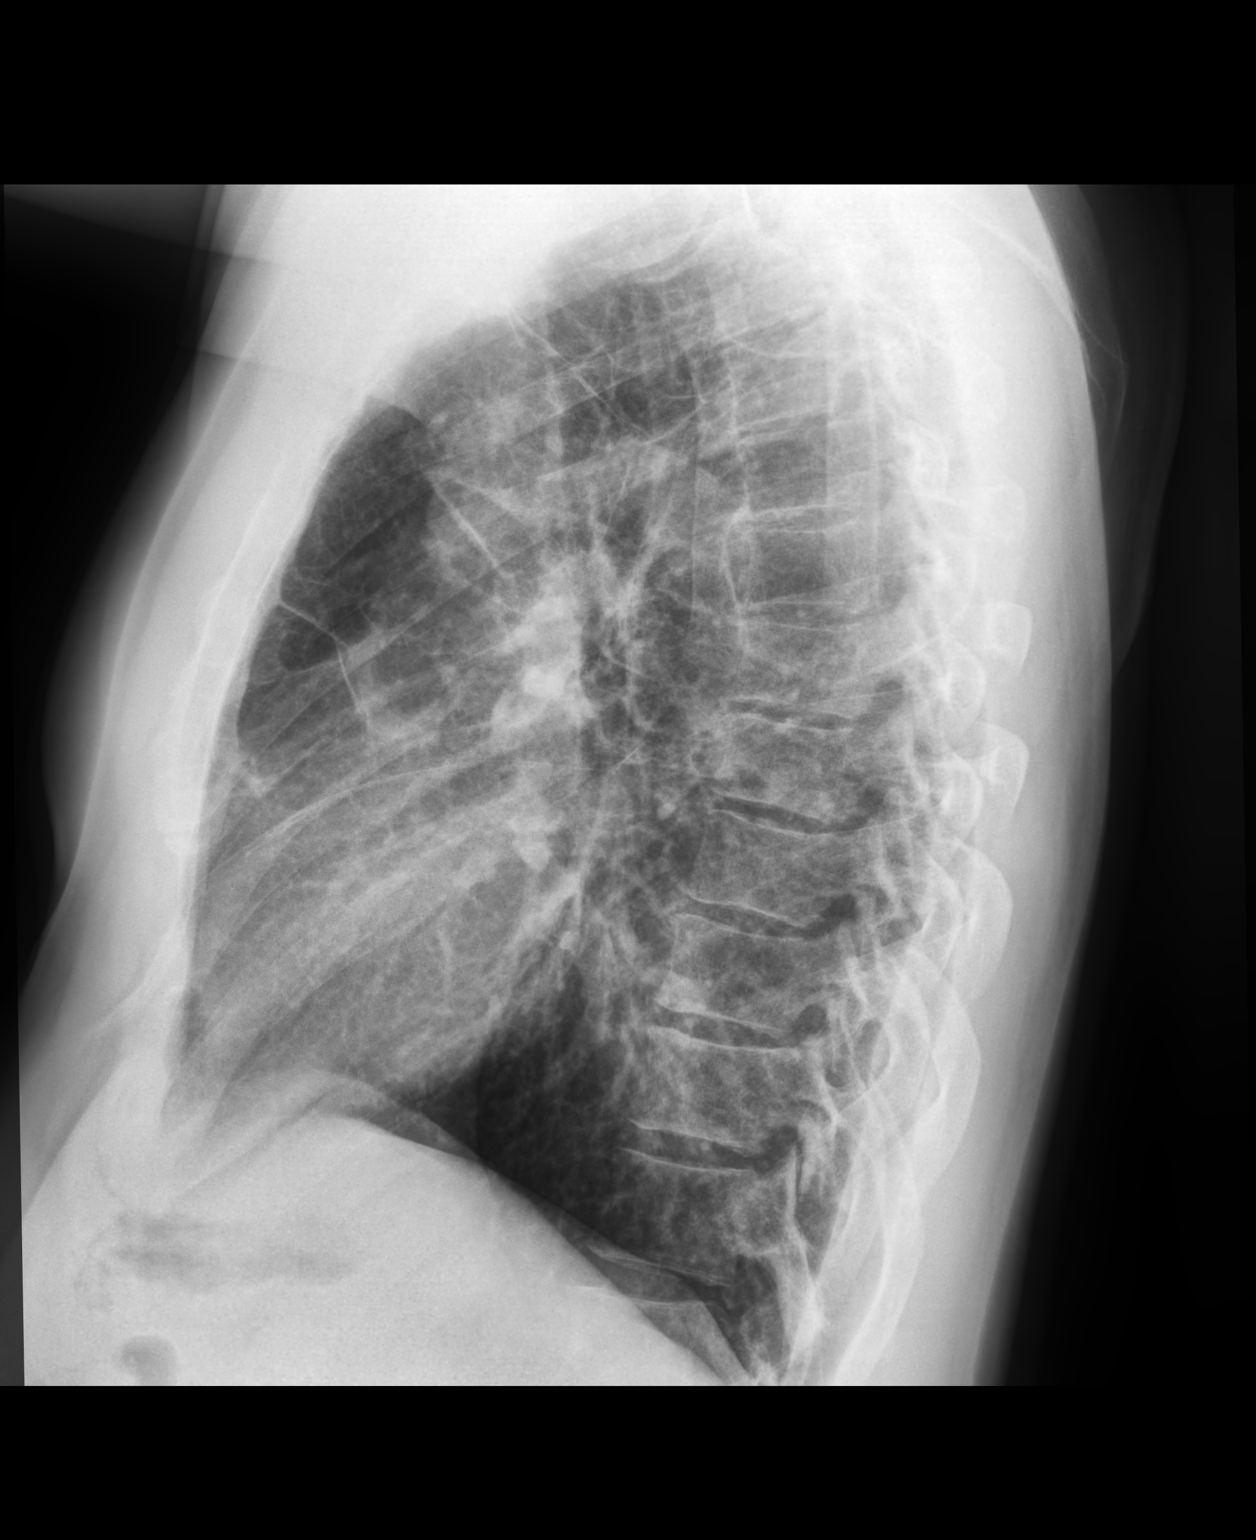

[2 of 2 positions shown; findings below may reference images not displayed]

FINDINGS: Heart size is normal. Mediastinum appears stable. No focal
consolidation identified. Lungs are hyperinflated with diffuse
prominent chronic interstitial lung markings as well as bullous
changes in the upper lungs, right greater than left. No pleural
effusion or pneumothorax.
IMPRESSION: Emphysematous changes with no focal consolidation identified.
